# Patient Record
Sex: Female | Born: 1990 | Race: Black or African American | Hispanic: No | Marital: Single | Smoking: Never smoker
Health system: Southern US, Community
[De-identification: ages and names within clinical notes are randomized; demographics above are authoritative.]

## PROBLEM LIST (undated history)

## (undated) DIAGNOSIS — E079 Disorder of thyroid, unspecified: Secondary | ICD-10-CM

---

## 2021-04-20 NOTE — L&D Delivery Note (Signed)
Delivery Note Labor onset:  04/10/22 Labor Onset Time: 1857 Complete dilation at 1:05 AM  Onset of pushing at 0105 FHR second stage Cat 2 Analgesia/Anesthesia intrapartum: epidural  Guided pushing with maternal urge. Delivery of a viable female at 0114. Fetal head delivered in LOA position.  Nuchal cord: none.  Infant placed on maternal abd, dried, and tactile stim.  Cord double clamped after 4 min and cut by Father.  RN x3 present for birth.  Cord blood sample collected: yes Arterial cord blood sample collected: no  Placenta delivered Schultz, intact, with 3 VC.  Placenta to pathology for IUGR. Uterine tone firm, bleeding small, clot x1  NO laceration identified.  Anesthesia: N/A Repair: N/A QBL/EBL (mL): 157 Complications: none APGAR: APGAR (1 MIN): 9   APGAR (5 MINS): 10   APGAR (10 MINS):   Mom to postpartum.  Baby to Couplet care / Skin to Skin. Baby boy "Joy Ali" Circumcision Yes  [name] DNP, CNM 04/11/2022, 1:31 AM

## 2021-06-11 ENCOUNTER — Other Ambulatory Visit: Payer: Self-pay

## 2021-06-11 ENCOUNTER — Ambulatory Visit
Admission: EM | Admit: 2021-06-11 | Discharge: 2021-06-11 | Disposition: A | Discharge status: Home / Self Care | Payer: BC Managed Care – PPO | Attending: Physician Assistant | Admitting: Physician Assistant

## 2021-06-11 DIAGNOSIS — J069 Acute upper respiratory infection, unspecified: Secondary | ICD-10-CM | POA: Diagnosis not present

## 2021-06-11 NOTE — ED Provider Notes (Signed)
[facility]    CSN: [id number] Arrival date & time: 06/11/21  1603      History   Chief Complaint Chief Complaint  Patient presents with   Cough    HPI Derriona Branscom is a 31 y.o. female.   Patient here today for evaluation of cough, congestion, sneezing, burning in her nose, loss of taste and smell, and 1 episode of vomiting that started yesterday.  She does note she has felt more fatigued as well.  She has tried over-the-counter medication without resolution of symptoms.  She believes she has had fever but has not measured temperature.  The history is provided by the patient.   History reviewed. No pertinent past medical history.  There are no problems to display for this patient.   History reviewed. No pertinent surgical history.  OB History   No obstetric history on file.      Home Medications    Prior to Admission medications   Not on File    Family History History reviewed. No pertinent family history.  Social History Social History   Tobacco Use   Smoking status: Never   Smokeless tobacco: Never     Allergies   Patient has no allergy information on record.   Review of Systems Review of Systems  Constitutional:  Positive for fever.  HENT:  Positive for congestion and sore throat. Negative for ear pain.   Eyes:  Negative for discharge and redness.  Respiratory:  Positive for cough. Negative for shortness of breath and wheezing.   Gastrointestinal:  Positive for nausea and vomiting. Negative for abdominal pain and diarrhea.    Physical Exam Triage Vital Signs ED Triage Vitals  Enc Vitals Group     BP      Pulse      Resp      Temp      Temp src      SpO2      Weight      Height      Head Circumference      Peak Flow      Pain Score      Pain Loc      Pain Edu?      Excl. in GC?    No data found.  Updated Vital Signs BP 109/73 (BP Location: Left Arm)    Pulse 100    Temp 98.5 F (36.9 C) (Oral)    Resp 18     SpO2 98%       Physical Exam Vitals and nursing note reviewed.  Constitutional:      General: She is not in acute distress.    Appearance: Normal appearance. She is not ill-appearing.  HENT:     Head: Normocephalic and atraumatic.     Nose: Congestion present.     Mouth/Throat:     Mouth: Mucous membranes are moist.     Pharynx: No oropharyngeal exudate or posterior oropharyngeal erythema.  Eyes:     Conjunctiva/sclera: Conjunctivae normal.  Cardiovascular:     Rate and Rhythm: Normal rate and regular rhythm.     Heart sounds: Normal heart sounds. No murmur heard. Pulmonary:     Effort: Pulmonary effort is normal. No respiratory distress.     Breath sounds: Normal breath sounds. No wheezing, rhonchi or rales.  Skin:    General: Skin is warm and dry.  Neurological:     Mental Status: She is alert.  Psychiatric:        Mood  and Affect: Mood normal.        Behavior: Behavior normal.        Thought Content: Thought content normal.     UC Treatments / Results  Labs (all labs ordered are listed, but only abnormal results are displayed) Labs Reviewed  COVID-19, FLU A+B NAA    EKG   Radiology No results found.  Procedures Procedures (including critical care time)  Medications Ordered in UC Medications - No data to display  Initial Impression / Assessment and Plan / UC Course  I have reviewed the triage vital signs and the nursing notes.  Pertinent labs & imaging results that were available during my care of the patient were reviewed by me and considered in my medical decision making (see chart for details).    Suspect viral etiology of symptoms.  Will order COVID and flu screening.  Will await results further recommendation.  Encouraged symptomatic treatment while awaiting results.  Patient expresses understanding.   Final Clinical Impressions(s) / UC Diagnoses   Final diagnoses:  Acute upper respiratory infection   Discharge Instructions   None    ED  Prescriptions   None    PDMP not reviewed this encounter.   Tomi Bamberger, PA-C 06/11/21 1715

## 2021-06-11 NOTE — ED Triage Notes (Signed)
Pt c/o cough, sneezing, "burning sensation in my nose," headache, nasal drainage, drowsiness onset yesterday

## 2021-06-12 LAB — COVID-19, FLU A+B NAA
Influenza A, NAA: NOT DETECTED
Influenza B, NAA: NOT DETECTED
SARS-CoV-2, NAA: NOT DETECTED

## 2021-06-30 ENCOUNTER — Other Ambulatory Visit: Payer: Self-pay

## 2021-06-30 ENCOUNTER — Emergency Department (HOSPITAL_COMMUNITY)
Admission: EM | Admit: 2021-06-30 | Discharge: 2021-06-30 | Disposition: A | Discharge status: Home / Self Care | Payer: BC Managed Care – PPO | Attending: Emergency Medicine | Admitting: Emergency Medicine

## 2021-06-30 ENCOUNTER — Encounter (HOSPITAL_COMMUNITY): Payer: Self-pay

## 2021-06-30 DIAGNOSIS — T7840XA Allergy, unspecified, initial encounter: Secondary | ICD-10-CM | POA: Insufficient documentation

## 2021-06-30 MED ORDER — PREDNISONE 20 MG PO TABS: 40.0000 mg | ORAL_TABLET | Freq: Every day | ORAL | 0 refills | Status: DC

## 2021-06-30 MED ORDER — DIPHENHYDRAMINE HCL 25 MG PO TABS: 25.0000 mg | ORAL_TABLET | Freq: Four times a day (QID) | ORAL | 0 refills | Status: DC | PRN

## 2021-06-30 MED ORDER — EPINEPHRINE 0.3 MG/0.3ML IJ SOAJ: 0.3000 mg | INTRAMUSCULAR | 0 refills | Status: AC | PRN

## 2021-06-30 MED ORDER — PREDNISONE 20 MG PO TABS
60.0000 mg | ORAL_TABLET | Freq: Once | ORAL | Status: AC
  Administered 2021-06-30: 60 mg via ORAL
  Filled 2021-06-30: qty 3

## 2021-06-30 MED ORDER — FAMOTIDINE 20 MG PO TABS
40.0000 mg | ORAL_TABLET | Freq: Once | ORAL | Status: AC
  Administered 2021-06-30: 40 mg via ORAL
  Filled 2021-06-30: qty 2

## 2021-06-30 NOTE — ED Provider Notes (Signed)
?[facility]-EMERGENCY DEPT ?Provider Note ? ? ?CSN: [id number] ?Arrival date & time: 06/30/21  0113 ? ?  ? ?History ? ?Chief Complaint  ?Patient presents with  ? Allergic Reaction  ? ? ?Joy Ali is a 31 y.o. female. ? ?Patient presents to the emergency department for evaluation of allergic reaction.  She states that she took a BC powder late last night.  In the past while taking aspirin she gets "itching" in her throat but tonight it felt like her throat was closing up.  She states that she had a bottle of Benadryl at home and "chugged" an unknown amount.  EMS was called.  She was given EpiPen around 11:30 PM.  She states that she thought she was fine and refused transport to the hospital.  No other treatments.  About 30 minutes after they left she started to "feel weird".  This prompted her to come to the emergency department.  She denies hives, vomiting, diarrhea.  Her "throat closing" is improved.  She currently feels somewhat short of breath.  No hallucinations. ? ? ?  ? ?Home Medications ?Prior to Admission medications   ?Not on File  ?   ? ?Allergies    ?Ibuprofen   ? ?Review of Systems   ?Review of Systems ? ?Physical Exam ?Updated Vital Signs ?BP (!) 122/108 (BP Location: Left Arm)   Pulse 86   Temp 98.7 ?F (37.1 ?C) (Oral)   Resp 17   Ht 5' (1.524 m)   Wt 83.9 kg   SpO2 100%   BMI 36.13 kg/m?  ?Physical Exam ?Vitals and nursing note reviewed.  ?Constitutional:   ?   Appearance: She is well-developed. She is not diaphoretic.  ?HENT:  ?   Head: Normocephalic and atraumatic.  ?   Mouth/Throat:  ?   Mouth: Mucous membranes are not dry.  ?   Comments: Oropharynx is clear.  No signs of angioedema. ?Eyes:  ?   Conjunctiva/sclera: Conjunctivae normal.  ?Neck:  ?   Vascular: Normal carotid pulses. No JVD.  ?   Trachea: Trachea normal. No tracheal deviation.  ?Cardiovascular:  ?   Rate and Rhythm: Normal rate and regular rhythm.  ?   Pulses: No decreased pulses.     ?     Radial  pulses are 2+ on the right side and 2+ on the left side.  ?   Heart sounds: Normal heart sounds, S1 normal and S2 normal. No murmur heard. ?   Comments: No tachycardia ?Pulmonary:  ?   Effort: Pulmonary effort is normal. No respiratory distress.  ?   Breath sounds: No wheezing.  ?   Comments: No wheezing ?Chest:  ?   Chest wall: No tenderness.  ?Abdominal:  ?   General: Bowel sounds are normal.  ?   Palpations: Abdomen is soft.  ?   Tenderness: There is no abdominal tenderness. There is no guarding or rebound.  ?Musculoskeletal:     ?   General: Normal range of motion.  ?   Cervical back: Normal range of motion and neck supple. No muscular tenderness.  ?   Right lower leg: No edema.  ?   Left lower leg: No edema.  ?Skin: ?   General: Skin is warm and dry.  ?   Coloration: Skin is not pale.  ?Neurological:  ?   Mental Status: She is alert.  ? ? ?ED Results / Procedures / Treatments   ?Labs ?(all labs ordered are listed, but  only abnormal results are displayed) ?Labs Reviewed - No data to display ? ?EKG ?None ? ?Radiology ?No results found. ? ?Procedures ?Procedures  ? ? ?Medications Ordered in ED ?Medications  ?predniSONE (DELTASONE) tablet 60 mg (has no administration in time range)  ?famotidine (PEPCID) tablet 40 mg (has no administration in time range)  ? ? ?ED Course/ Medical Decision Making/ A&P ?  ? ?Patient seen and examined. History obtained directly from patient.  ? ?Labs/EKG: Ordered EKG.  Reviewed, no arrhythmia or signs of QT prolongation. ? ?Imaging: None ordered ? ?Medications/Fluids: Ordered: Oral prednisone, Pepcid. ? ?Most recent vital signs reviewed and are as follows: ?BP (!) 122/108 (BP Location: Left Arm)   Pulse 86   Temp 98.7 ?F (37.1 ?C) (Oral)   Resp 17   Ht 5' (1.524 m)   Wt 83.9 kg   SpO2 100%   BMI 36.13 kg/m?  ? ?Initial impression: Likely allergic reaction.  Patient may be having some anxiety symptoms related to the administration of epinephrine.  Also, consider anticholinergic  symptoms as patient took unknown amount of Benadryl. ? ?2:34 AM Reassessment performed. Patient appears comfortable.  She states that she is doing better. ? ?Reviewed pertinent lab work and imaging with patient at bedside. Questions answered.  ? ?Most current vital signs reviewed and are as follows: ?BP 110/76   Pulse 69   Temp 98.7 ?F (37.1 ?C) (Oral)   Resp 18   Ht 5' (1.524 m)   Wt 83.9 kg   SpO2 99%   BMI 36.13 kg/m?  ? ?Plan: Monitor until 3:30 AM.  If no rebound symptoms or signs of anticholinergic toxicity, plan for discharge home. ? ?3:29 AM Reassessment performed. Patient appears comfortable.  States that she continues to feel well.  She is ready to go home. ? ?Most current vital signs reviewed and are as follows: ?BP (!) 143/100   Pulse 75   Temp 98.7 ?F (37.1 ?C) (Oral)   Resp 14   Ht 5' (1.524 m)   Wt 83.9 kg   SpO2 99%   BMI 36.13 kg/m?  ? ?Plan: Discharge to home.  ? ?Prescriptions written for: Prednisone, Benadryl, EpiPen ? ?Other home care instructions discussed: Avoidance of allergens ? ?ED return instructions discussed: Encouraged patient to call 911 if symptoms recur, especially if it involves her throat or breathing. ? ?Follow-up instructions discussed: Patient encouraged to follow-up with their PCP as needed. ? ?                        ?Medical Decision Making ?Risk ?Prescription drug management. ? ? ?Patient with allergic reaction, likely to aspirin.  She was treated with an EpiPen but began to feel strange after the administration.  She also took an unknown amount of Benadryl.  Symptoms likely side effect of epinephrine.  This is now resolved.  Considered anticholinergic overdose, however patient has not had any progressive symptoms, EKG changes, and has been stable during course of ED visit. ? ?The patient's vital signs, pertinent lab work and imaging were reviewed and interpreted as discussed in the ED course. Hospitalization was considered for further testing, treatments, or  serial exams/observation. However as patient is well-appearing, has a stable exam, and reassuring studies today, I do not feel that they warrant admission at this time. This plan was discussed with the patient who verbalizes agreement and comfort with this plan and seems reliable and able to return to the Emergency Department with worsening or changing  symptoms.  ? ? ? ? ? ? ? ? ?Final Clinical Impression(s) / ED Diagnoses ?Final diagnoses:  ?Allergic reaction, initial encounter  ? ? ?Rx / DC Orders ?ED Discharge Orders   ? ? None  ? ?  ? ? ?  ?Renne Crigler, PA-C ?06/30/21 1610 ? ?  ?Paula Libra, MD ?06/30/21 0518 ? ?

## 2021-06-30 NOTE — ED Triage Notes (Signed)
Patient arrived stating she took a BC powder and began having an allergic reaction, took a benadryl and given an epi pen by EMS around 1130pm. States her chest feels weird from the medication and her throat feels swollen. Speaking in full sentences in triage.  ?

## 2021-06-30 NOTE — Discharge Instructions (Signed)
Please read and follow all provided instructions. ? ?Your diagnoses today include:  ?1. Allergic reaction, initial encounter   ? ? ?Tests performed today include: ?Vital signs. See below for your results today.  ? ?Medications prescribed:  ?Prednisone - steroid medicine  ? ?It is best to take this medication in the morning to prevent sleeping problems. If you are diabetic, monitor your blood sugar closely and stop taking Prednisone if blood sugar is over 300. Take with food to prevent stomach upset.  ? ?Benadryl (diphenhydramine) - antihistamine ? ?You can find this medication over-the-counter.  ? ?DO NOT exceed:  ?50mg  Benadryl every 6 hours   ? ?Benadryl will make you drowsy. DO NOT drive or perform any activities that require you to be awake and alert if taking this. ? ?Epi-pen ?Inject into thigh as directed if you have a severe reaction that causes throat swelling or any trouble breathing. Call 9-1-1 immediately if you use an Epi-pen. You should be evaluated at a hospital as soon as possible.   ? ?Take any prescribed medications only as directed. ? ?Home care instructions:  ?Follow any educational materials contained in this packet ? ?Follow-up instructions: ?Please follow-up with your primary care provider in the next 3 days for further evaluation of your symptoms.  ? ?Return instructions:  ?Please return to the Emergency Department if you experience worsening symptoms.  ?Call 9-1-1 immediately if you have an allergic reaction that involves your lips, mouth, throat or if you have any difficulty breathing. This is a life-threatening emergency.  ?Please return if you have any other emergent concerns. ? ?Additional Information: ? ?Your vital signs today were: ?BP (!) 139/100   Pulse 65   Temp 98.7 ?F (37.1 ?C) (Oral)   Resp 13   Ht 5' (1.524 m)   Wt 83.9 kg   SpO2 100%   BMI 36.13 kg/m?  ?If your blood pressure (BP) was elevated above 135/85 this visit, please have this repeated by your doctor within one  month. ?-------------- ? ?

## 2021-07-01 ENCOUNTER — Other Ambulatory Visit: Payer: Self-pay

## 2021-07-01 ENCOUNTER — Ambulatory Visit
Admission: EM | Admit: 2021-07-01 | Discharge: 2021-07-01 | Disposition: A | Discharge status: Home / Self Care | Payer: BC Managed Care – PPO | Attending: Physician Assistant | Admitting: Physician Assistant

## 2021-07-01 DIAGNOSIS — H0100A Unspecified blepharitis right eye, upper and lower eyelids: Secondary | ICD-10-CM

## 2021-07-01 DIAGNOSIS — H0100B Unspecified blepharitis left eye, upper and lower eyelids: Secondary | ICD-10-CM

## 2021-07-01 MED ORDER — ERYTHROMYCIN 5 MG/GM OP OINT
TOPICAL_OINTMENT | OPHTHALMIC | 0 refills | Status: DC
Start: 2021-07-01 — End: 2022-04-11

## 2021-07-01 NOTE — ED Provider Notes (Signed)
?[facility] ? ? ? ?CSN: [id number] ?Arrival date & time: 07/01/21  1342 ? ? ?  ? ?History   ?Chief Complaint ?Chief Complaint  ?Patient presents with  ? Facial Swelling  ?  Right eye  ? ? ?HPI ?Joy Ali is a 31 y.o. female.  ? ?Patient here today for evaluation of swelling of bilateral eyelids that first started this morning.  She states that she had allergic reaction that she was treated for in the ED but is not sure if this is related to same. She reports some drainage from her eyes. She has not had any fever. She has been taking prednisone as prescribed.  ? ?The history is provided by the patient.  ? ?History reviewed. No pertinent past medical history. ? ?There are no problems to display for this patient. ? ? ?History reviewed. No pertinent surgical history. ? ?OB History   ?No obstetric history on file. ?  ? ? ? ?Home Medications   ? ?Prior to Admission medications   ?Medication Sig Start Date End Date Taking? Authorizing Provider  ?erythromycin ophthalmic ointment Place a 1/2 inch ribbon of ointment into the lower eyelid. 07/01/21  Yes Tomi Bamberger, PA-C  ?diphenhydrAMINE (BENADRYL) 25 MG tablet Take 1 tablet (25 mg total) by mouth every 6 (six) hours as needed. 06/30/21   Renne Crigler, PA-C  ?EPINEPHrine 0.3 mg/0.3 mL IJ SOAJ injection Inject 0.3 mg into the muscle as needed for anaphylaxis. 06/30/21   Renne Crigler, PA-C  ?predniSONE (DELTASONE) 20 MG tablet Take 2 tablets (40 mg total) by mouth daily. 06/30/21   Renne Crigler, PA-C  ? ? ?Family History ?Family History  ?Problem Relation Age of Onset  ? Hypertension Father   ? Diabetes Father   ? ? ?Social History ?Social History  ? ?Tobacco Use  ? Smoking status: Never  ? Smokeless tobacco: Never  ? ? ? ?Allergies   ?Ibuprofen ? ? ?Review of Systems ?Review of Systems  ?Constitutional:  Negative for chills and fever.  ?HENT:  Negative for congestion.   ?Eyes:  Negative for discharge, redness and visual disturbance.  ?Respiratory:   Negative for shortness of breath.   ?Gastrointestinal:  Negative for abdominal pain, nausea and vomiting.  ? ? ?Physical Exam ?Triage Vital Signs ?ED Triage Vitals  ?Enc Vitals Group  ?   BP 07/01/21 1423 121/73  ?   Pulse Rate 07/01/21 1423 78  ?   Resp 07/01/21 1423 18  ?   Temp 07/01/21 1423 98.3 ?F (36.8 ?C)  ?   Temp Source 07/01/21 1423 Oral  ?   SpO2 07/01/21 1423 99 %  ?   Weight --   ?   Height --   ?   Head Circumference --   ?   Peak Flow --   ?   Pain Score 07/01/21 1427 6  ?   Pain Loc --   ?   Pain Edu? --   ?   Excl. in GC? --   ? ?No data found. ? ?Updated Vital Signs ?BP 121/73 (BP Location: Left Arm)   Pulse 78   Temp 98.3 ?F (36.8 ?C) (Oral)   Resp 18   SpO2 99%  ? ? ?Physical Exam ?Vitals and nursing note reviewed.  ?Constitutional:   ?   General: She is not in acute distress. ?   Appearance: Normal appearance. She is not ill-appearing.  ?HENT:  ?   Head: Normocephalic and atraumatic.  ?Eyes:  ?  Conjunctiva/sclera: Conjunctivae normal.  ?   Comments: Bilateral upper and lower lids swollen with right worse than left  ?Cardiovascular:  ?   Rate and Rhythm: Normal rate.  ?Pulmonary:  ?   Effort: Pulmonary effort is normal.  ?Neurological:  ?   Mental Status: She is alert.  ?Psychiatric:     ?   Mood and Affect: Mood normal.     ?   Behavior: Behavior normal.     ?   Thought Content: Thought content normal.  ? ? ? ?UC Treatments / Results  ?Labs ?(all labs ordered are listed, but only abnormal results are displayed) ?Labs Reviewed - No data to display ? ?EKG ? ? ?Radiology ?No results found. ? ?Procedures ?Procedures (including critical care time) ? ?Medications Ordered in UC ?Medications - No data to display ? ?Initial Impression / Assessment and Plan / UC Course  ?I have reviewed the triage vital signs and the nursing notes. ? ?Pertinent labs & imaging results that were available during my care of the patient were reviewed by me and considered in my medical decision making (see chart for  details). ? ?  ?Will treat with antibiotic ointment and recommended cool compresses. Encouraged she continue her prednisone and follow up with any further concerns.  ? ?Final Clinical Impressions(s) / UC Diagnoses  ? ?Final diagnoses:  ?Blepharitis of upper and lower eyelids of both eyes, unspecified type  ? ?Discharge Instructions   ?None ?  ? ?ED Prescriptions   ? ? Medication Sig Dispense Auth. Provider  ? erythromycin ophthalmic ointment Place a 1/2 inch ribbon of ointment into the lower eyelid. 3.5 g Tomi Bamberger, PA-C  ? ?  ? ?PDMP not reviewed this encounter. ?  ?Tomi Bamberger, PA-C ?07/01/21 1501 ? ?

## 2021-07-01 NOTE — ED Triage Notes (Signed)
Seen at the ED yesterday for an allergic reaction. Pt reports that she woke up this morning to see that her right eye was swollen. She also complains of drowsiness. Pt is concerned that the meds she received in the ED could be causing sxs.Notes pain w/blinking and minimal drainage. No decrease in visual acuity. ?

## 2021-07-15 ENCOUNTER — Ambulatory Visit
Admission: EM | Admit: 2021-07-15 | Discharge: 2021-07-15 | Disposition: A | Discharge status: Home / Self Care | Payer: BC Managed Care – PPO | Attending: Physician Assistant | Admitting: Physician Assistant

## 2021-07-15 ENCOUNTER — Other Ambulatory Visit: Payer: Self-pay

## 2021-07-15 DIAGNOSIS — J3489 Other specified disorders of nose and nasal sinuses: Secondary | ICD-10-CM | POA: Diagnosis not present

## 2021-07-15 MED ORDER — MUPIROCIN 2 % EX OINT: 1.0000 "application " | TOPICAL_OINTMENT | Freq: Two times a day (BID) | CUTANEOUS | 0 refills | Status: AC

## 2021-07-15 NOTE — ED Provider Notes (Signed)
?[facility] ? ? ? ?CSN: [id number] ?Arrival date & time: 07/15/21  1152 ? ? ?  ? ?History   ?Chief Complaint ?Chief Complaint  ?Patient presents with  ? sores in nares  ? ? ?HPI ?Joy Ali is a 31 y.o. female.  ? ?Patient here today for evaluation of painful lesions in her nose that has been present for the last 2 weeks. She reports they have appeared like "pimples" at times. She has not used any topical treatment. She has not had any fever or other symptoms.  ? ?The history is provided by the patient.  ? ?History reviewed. No pertinent past medical history. ? ?There are no problems to display for this patient. ? ? ?History reviewed. No pertinent surgical history. ? ?OB History   ?No obstetric history on file. ?  ? ? ? ?Home Medications   ? ?Prior to Admission medications   ?Medication Sig Start Date End Date Taking? Authorizing Provider  ?mupirocin ointment (BACTROBAN) 2 % Apply 1 application. topically 2 (two) times daily for 7 days. 07/15/21 07/22/21 Yes Tomi Bamberger, PA-C  ?diphenhydrAMINE (BENADRYL) 25 MG tablet Take 1 tablet (25 mg total) by mouth every 6 (six) hours as needed. 06/30/21   Renne Crigler, PA-C  ?EPINEPHrine 0.3 mg/0.3 mL IJ SOAJ injection Inject 0.3 mg into the muscle as needed for anaphylaxis. 06/30/21   Renne Crigler, PA-C  ?erythromycin ophthalmic ointment Place a 1/2 inch ribbon of ointment into the lower eyelid. 07/01/21   Tomi Bamberger, PA-C  ?predniSONE (DELTASONE) 20 MG tablet Take 2 tablets (40 mg total) by mouth daily. 06/30/21   Renne Crigler, PA-C  ? ? ?Family History ?Family History  ?Problem Relation Age of Onset  ? Hypertension Father   ? Diabetes Father   ? ? ?Social History ?Social History  ? ?Tobacco Use  ? Smoking status: Never  ? Smokeless tobacco: Never  ? ? ? ?Allergies   ?Ibuprofen ? ? ?Review of Systems ?Review of Systems  ?Constitutional:  Negative for chills and fever.  ?HENT:  Negative for congestion.   ?Eyes:  Negative for discharge and  redness.  ?Respiratory:  Negative for shortness of breath.   ?Gastrointestinal:  Negative for abdominal pain, nausea and vomiting.  ? ? ?Physical Exam ?Triage Vital Signs ?ED Triage Vitals  ?Enc Vitals Group  ?   BP 07/15/21 1228 119/80  ?   Pulse Rate 07/15/21 1228 79  ?   Resp 07/15/21 1228 18  ?   Temp 07/15/21 1228 98.2 ?F (36.8 ?C)  ?   Temp Source 07/15/21 1228 Oral  ?   SpO2 07/15/21 1228 98 %  ?   Weight --   ?   Height --   ?   Head Circumference --   ?   Peak Flow --   ?   Pain Score 07/15/21 1226 6  ?   Pain Loc --   ?   Pain Edu? --   ?   Excl. in GC? --   ? ?No data found. ? ?Updated Vital Signs ?BP 119/80 (BP Location: Right Arm)   Pulse 79   Temp 98.2 ?F (36.8 ?C) (Oral)   Resp 18   SpO2 98%  ?   ? ?Physical Exam ?Vitals and nursing note reviewed.  ?Constitutional:   ?   General: She is not in acute distress. ?   Appearance: Normal appearance. She is not ill-appearing.  ?HENT:  ?   Head: Normocephalic and atraumatic.  ?  Nose:  ?   Comments: Few erythematous papules noted to bilateral nares ?Eyes:  ?   Conjunctiva/sclera: Conjunctivae normal.  ?Cardiovascular:  ?   Rate and Rhythm: Normal rate.  ?Pulmonary:  ?   Effort: Pulmonary effort is normal.  ?Neurological:  ?   Mental Status: She is alert.  ?Psychiatric:     ?   Mood and Affect: Mood normal.     ?   Behavior: Behavior normal.     ?   Thought Content: Thought content normal.  ? ? ? ?UC Treatments / Results  ?Labs ?(all labs ordered are listed, but only abnormal results are displayed) ?Labs Reviewed - No data to display ? ?EKG ? ? ?Radiology ?No results found. ? ?Procedures ?Procedures (including critical care time) ? ?Medications Ordered in UC ?Medications - No data to display ? ?Initial Impression / Assessment and Plan / UC Course  ?I have reviewed the triage vital signs and the nursing notes. ? ?Pertinent labs & imaging results that were available during my care of the patient were reviewed by me and considered in my medical decision  making (see chart for details). ? ?  ?Will trial mupirocin ointment and recommend follow up with any further concerns.  ? ?Final Clinical Impressions(s) / UC Diagnoses  ? ?Final diagnoses:  ?Nasal lesion  ? ?Discharge Instructions   ?None ?  ? ?ED Prescriptions   ? ? Medication Sig Dispense Auth. Provider  ? mupirocin ointment (BACTROBAN) 2 % Apply 1 application. topically 2 (two) times daily for 7 days. 22 g Tomi Bamberger, PA-C  ? ?  ? ?PDMP not reviewed this encounter. ?  ?Tomi Bamberger, PA-C ?07/15/21 1256 ? ?

## 2021-07-15 NOTE — ED Triage Notes (Signed)
2wk h/o painful sores in nares. ?Pt describes the sores as "red and white tiny pimples". No ointments or nasal sprays used. Notes some itching. ?

## 2021-07-22 ENCOUNTER — Ambulatory Visit
Admission: EM | Admit: 2021-07-22 | Discharge: 2021-07-22 | Disposition: A | Discharge status: Home / Self Care | Payer: BC Managed Care – PPO | Attending: Internal Medicine | Admitting: Internal Medicine

## 2021-07-22 DIAGNOSIS — H5789 Other specified disorders of eye and adnexa: Secondary | ICD-10-CM | POA: Diagnosis not present

## 2021-07-22 NOTE — ED Triage Notes (Signed)
Pt said once she woke up this am she noticed her eyes were irritated and red, they are itchy and sore to touch. No blurred vision. Pt does have allergies. On zyrtec and eye allergy stuff.  ?

## 2021-07-22 NOTE — Discharge Instructions (Signed)
Go to [facility] at provided address tomorrow at 8 AM for further evaluation and management. ?

## 2021-07-22 NOTE — ED Provider Notes (Signed)
?[facility] ? ? ? ?CSN: [id number] ?Arrival date & time: 07/22/21  1711 ? ? ?  ? ?History   ?Chief Complaint ?Chief Complaint  ?Patient presents with  ? Eye Problem  ? ? ?HPI ?Joy Ali is a 31 y.o. female.  ? ?Patient presents for persistent bilateral eye redness and irritation that started upon awakening this morning.  Denies any purulent drainage.  Patient does report some blurry vision upon awakening.  Does not wear contacts or glasses.  Denies trauma or foreign body to the eye.  Patient is attributing symptoms to allergies and has taken Zyrtec and Visine with minimal improvement.  She was seen on 3/14 and was diagnosed with blepharitis.  She was prescribed erythromycin ointment which she has tried at this time as well with no improvement. ? ? ?Eye Problem ? ?No past medical history on file. ? ?There are no problems to display for this patient. ? ? ?No past surgical history on file. ? ?OB History   ?No obstetric history on file. ?  ? ? ? ?Home Medications   ? ?Prior to Admission medications   ?Medication Sig Start Date End Date Taking? Authorizing Provider  ?diphenhydrAMINE (BENADRYL) 25 MG tablet Take 1 tablet (25 mg total) by mouth every 6 (six) hours as needed. 06/30/21   Renne Crigler, PA-C  ?EPINEPHrine 0.3 mg/0.3 mL IJ SOAJ injection Inject 0.3 mg into the muscle as needed for anaphylaxis. 06/30/21   Renne Crigler, PA-C  ?erythromycin ophthalmic ointment Place a 1/2 inch ribbon of ointment into the lower eyelid. 07/01/21   Tomi Bamberger, PA-C  ?mupirocin ointment (BACTROBAN) 2 % Apply 1 application. topically 2 (two) times daily for 7 days. 07/15/21 07/22/21  Tomi Bamberger, PA-C  ?predniSONE (DELTASONE) 20 MG tablet Take 2 tablets (40 mg total) by mouth daily. 06/30/21   Renne Crigler, PA-C  ? ? ?Family History ?Family History  ?Problem Relation Age of Onset  ? Hypertension Father   ? Diabetes Father   ? ? ?Social History ?Social History  ? ?Tobacco Use  ? Smoking status: Never  ?  Smokeless tobacco: Never  ? ? ? ?Allergies   ?Ibuprofen ? ? ?Review of Systems ?Review of Systems ?Per HPI ? ?Physical Exam ?Triage Vital Signs ?ED Triage Vitals  ?Enc Vitals Group  ?   BP 07/22/21 1748 109/79  ?   Pulse Rate 07/22/21 1748 98  ?   Resp 07/22/21 1748 16  ?   Temp 07/22/21 1748 98.6 ?F (37 ?C)  ?   Temp Source 07/22/21 1748 Oral  ?   SpO2 07/22/21 1748 97 %  ?   Weight --   ?   Height --   ?   Head Circumference --   ?   Peak Flow --   ?   Pain Score 07/22/21 1747 6  ?   Pain Loc --   ?   Pain Edu? --   ?   Excl. in GC? --   ? ?No data found. ? ?Updated Vital Signs ?BP 109/79 (BP Location: Right Arm)   Pulse 98   Temp 98.6 ?F (37 ?C) (Oral)   Resp 16   SpO2 97%  ? ?Visual Acuity ?Right Eye Distance: 20 20 ?Left Eye Distance: 20 20 ?Bilateral Distance: 20 20 ? ?Right Eye Near:   ?Left Eye Near:    ?Bilateral Near:    ? ?Physical Exam ?Constitutional:   ?   General: She is not in acute distress. ?  Appearance: Normal appearance. She is not toxic-appearing or diaphoretic.  ?HENT:  ?   Head: Normocephalic and atraumatic.  ?Eyes:  ?   General: Lids are normal. Lids are everted, no foreign bodies appreciated. Vision grossly intact. Gaze aligned appropriately.  ?   Extraocular Movements: Extraocular movements intact.  ?   Conjunctiva/sclera: Conjunctivae normal.  ?   Pupils: Pupils are equal, round, and reactive to light.  ?   Comments: Scleral redness throughout.  Conjunctive are normal.  No purulent drainage.  No swelling.  ?Pulmonary:  ?   Effort: Pulmonary effort is normal.  ?Neurological:  ?   General: No focal deficit present.  ?   Mental Status: She is alert and oriented to person, place, and time. Mental status is at baseline.  ?Psychiatric:     ?   Mood and Affect: Mood normal.     ?   Behavior: Behavior normal.     ?   Thought Content: Thought content normal.     ?   Judgment: Judgment normal.  ? ? ? ?UC Treatments / Results  ?Labs ?(all labs ordered are listed, but only abnormal results are  displayed) ?Labs Reviewed - No data to display ? ?EKG ? ? ?Radiology ?No results found. ? ?Procedures ?Procedures (including critical care time) ? ?Medications Ordered in UC ?Medications - No data to display ? ?Initial Impression / Assessment and Plan / UC Course  ?I have reviewed the triage vital signs and the nursing notes. ? ?Pertinent labs & imaging results that were available during my care of the patient were reviewed by me and considered in my medical decision making (see chart for details). ? ?  ? ?Unsure etiology of patient's eye symptoms.  Does not appear to be simple conjunctivitis.  Due to symptoms being refractory to medications, Dr. Sherryll Burger with on-call ophthalmology was consulted.  He would like to see her in office tomorrow at 8 AM for further evaluation and management.  Patient was agreeable with plan and provided with contact information and address.  Discussed return precautions.  Visual acuity normal.  Patient verbalized understanding and was agreeable with plan ?Final Clinical Impressions(s) / UC Diagnoses  ? ?Final diagnoses:  ?Eye irritation  ? ? ? ?Discharge Instructions   ? ?  ?Go to [facility] at provided address tomorrow at 8 AM for further evaluation and management. ? ? ? ? ?ED Prescriptions   ?None ?  ? ?PDMP not reviewed this encounter. ?  ?Gustavus Bryant, [place] ?07/22/21 1821 ? ?

## 2021-08-24 ENCOUNTER — Other Ambulatory Visit: Payer: Self-pay

## 2021-08-24 ENCOUNTER — Emergency Department (HOSPITAL_COMMUNITY)
Admission: EM | Admit: 2021-08-24 | Discharge: 2021-08-24 | Disposition: A | Discharge status: Home / Self Care | Payer: BC Managed Care – PPO | Attending: Emergency Medicine | Admitting: Emergency Medicine

## 2021-08-24 ENCOUNTER — Encounter (HOSPITAL_COMMUNITY): Payer: Self-pay

## 2021-08-24 DIAGNOSIS — D649 Anemia, unspecified: Secondary | ICD-10-CM | POA: Insufficient documentation

## 2021-08-24 DIAGNOSIS — O26891 Other specified pregnancy related conditions, first trimester: Secondary | ICD-10-CM | POA: Insufficient documentation

## 2021-08-24 DIAGNOSIS — O99011 Anemia complicating pregnancy, first trimester: Secondary | ICD-10-CM | POA: Diagnosis not present

## 2021-08-24 DIAGNOSIS — Z3A00 Weeks of gestation of pregnancy not specified: Secondary | ICD-10-CM | POA: Insufficient documentation

## 2021-08-24 DIAGNOSIS — O99281 Endocrine, nutritional and metabolic diseases complicating pregnancy, first trimester: Secondary | ICD-10-CM | POA: Diagnosis not present

## 2021-08-24 DIAGNOSIS — E876 Hypokalemia: Secondary | ICD-10-CM | POA: Insufficient documentation

## 2021-08-24 DIAGNOSIS — Z331 Pregnant state, incidental: Secondary | ICD-10-CM | POA: Insufficient documentation

## 2021-08-24 DIAGNOSIS — R002 Palpitations: Secondary | ICD-10-CM | POA: Diagnosis not present

## 2021-08-24 DIAGNOSIS — Z79899 Other long term (current) drug therapy: Secondary | ICD-10-CM | POA: Diagnosis not present

## 2021-08-24 DIAGNOSIS — R5383 Other fatigue: Secondary | ICD-10-CM

## 2021-08-24 DIAGNOSIS — E039 Hypothyroidism, unspecified: Secondary | ICD-10-CM | POA: Diagnosis not present

## 2021-08-24 LAB — CBC WITH DIFFERENTIAL/PLATELET
Abs Immature Granulocytes: 0.04 10*3/uL (ref 0.00–0.07)
Basophils Absolute: 0 10*3/uL (ref 0.0–0.1)
Basophils Relative: 1 %
Eosinophils Absolute: 0.2 10*3/uL (ref 0.0–0.5)
Eosinophils Relative: 2 %
HCT: 32.6 % — ABNORMAL LOW (ref 36.0–46.0)
Hemoglobin: 11.2 g/dL — ABNORMAL LOW (ref 12.0–15.0)
Immature Granulocytes: 1 %
Lymphocytes Relative: 29 %
Lymphs Abs: 2.5 10*3/uL (ref 0.7–4.0)
MCH: 29.3 pg (ref 26.0–34.0)
MCHC: 34.4 g/dL (ref 30.0–36.0)
MCV: 85.3 fL (ref 80.0–100.0)
Monocytes Absolute: 0.9 10*3/uL (ref 0.1–1.0)
Monocytes Relative: 10 %
Neutro Abs: 5.1 10*3/uL (ref 1.7–7.7)
Neutrophils Relative %: 57 %
Platelets: 338 10*3/uL (ref 150–400)
RBC: 3.82 MIL/uL — ABNORMAL LOW (ref 3.87–5.11)
RDW: 14.4 % (ref 11.5–15.5)
WBC: 8.7 10*3/uL (ref 4.0–10.5)
nRBC: 0 % (ref 0.0–0.2)

## 2021-08-24 LAB — TSH: TSH: 7.193 u[IU]/mL — ABNORMAL HIGH (ref 0.350–4.500)

## 2021-08-24 LAB — URINALYSIS, ROUTINE W REFLEX MICROSCOPIC
Bilirubin Urine: NEGATIVE
Glucose, UA: NEGATIVE mg/dL
Ketones, ur: NEGATIVE mg/dL
Nitrite: NEGATIVE
Protein, ur: NEGATIVE mg/dL
Specific Gravity, Urine: 1.02 (ref 1.005–1.030)
pH: 6 (ref 5.0–8.0)

## 2021-08-24 LAB — T4, FREE: Free T4: 0.86 ng/dL (ref 0.61–1.12)

## 2021-08-24 LAB — COMPREHENSIVE METABOLIC PANEL
ALT: 12 U/L (ref 0–44)
AST: 19 U/L (ref 15–41)
Albumin: 3.9 g/dL (ref 3.5–5.0)
Alkaline Phosphatase: 34 U/L — ABNORMAL LOW (ref 38–126)
Anion gap: 6 (ref 5–15)
BUN: 9 mg/dL (ref 6–20)
CO2: 24 mmol/L (ref 22–32)
Calcium: 8.4 mg/dL — ABNORMAL LOW (ref 8.9–10.3)
Chloride: 106 mmol/L (ref 98–111)
Creatinine, Ser: 0.8 mg/dL (ref 0.44–1.00)
GFR, Estimated: >60 mL/min (ref >60)
Glucose, Bld: 111 mg/dL — ABNORMAL HIGH (ref 70–99)
Potassium: 3.2 mmol/L — ABNORMAL LOW (ref 3.5–5.1)
Sodium: 136 mmol/L (ref 135–145)
Total Bilirubin: 0.9 mg/dL (ref 0.3–1.2)
Total Protein: 7 g/dL (ref 6.5–8.1)

## 2021-08-24 LAB — RAPID URINE DRUG SCREEN, HOSP PERFORMED
Amphetamines: NOT DETECTED
Barbiturates: NOT DETECTED
Benzodiazepines: NOT DETECTED
Cocaine: NOT DETECTED
Opiates: NOT DETECTED
Tetrahydrocannabinol: NOT DETECTED

## 2021-08-24 LAB — I-STAT BETA HCG BLOOD, ED (MC, WL, AP ONLY): I-stat hCG, quantitative: >2000 m[IU]/mL — ABNORMAL HIGH (ref <5)

## 2021-08-24 MED ORDER — LEVOTHYROXINE SODIUM 125 MCG PO TABS: 125.0000 ug | ORAL_TABLET | Freq: Every day | ORAL | 0 refills | Status: AC

## 2021-08-24 MED ORDER — SODIUM CHLORIDE 0.9 % IV BOLUS
1000.0000 mL | Freq: Once | INTRAVENOUS | Status: AC
  Administered 2021-08-24: 1000 mL via INTRAVENOUS

## 2021-08-24 NOTE — ED Provider Notes (Signed)
?[facility]-EMERGENCY DEPT ?Provider Note ? ? ?CSN: [id number] ?Arrival date & time: 08/24/21  0208 ? ?  ? ?History ? ?Chief Complaint  ?Patient presents with  ? Fatigue  ? ? ?Joy Ali is a 31 y.o. female. ? ?31 year old female brought in by EMS with complaint of not feeling well today. Patient reports feeling fatigued today, tried to lay down and sleep however was unable to sleep so she stood up and at that time felt lightheaded, dizzy and nauseous, felt her heart was racing.  Patient called EMS, states she was able to walk to the ambulance but had another episode of heart racing while in transit to the emergency room tonight.  States that she is only had about 2 bottles of water today, mouth is dry, has only had some chicken wings to eat in the past 24 hours.  Reports that she could possibly be pregnant (LMP 07/21/21, G3P1), is not breast-feeding.  No significant past medical history.  States that she did injure her back at work a few days ago, was prescribed a muscle relaxer but is not taking it.  Denies changes in bowel or bladder habits, vomiting, fevers or chills.  No other complaints or concerns today. ? ? ?  ? ?Home Medications ?Prior to Admission medications   ?Medication Sig Start Date End Date Taking? Authorizing Provider  ?levothyroxine (SYNTHROID) 125 MCG tablet Take 1 tablet (125 mcg total) by mouth daily before breakfast. 08/24/21 09/23/21 Yes Jeannie Fend, PA-C  ?diphenhydrAMINE (BENADRYL) 25 MG tablet Take 1 tablet (25 mg total) by mouth every 6 (six) hours as needed. 06/30/21   Renne Crigler, PA-C  ?EPINEPHrine 0.3 mg/0.3 mL IJ SOAJ injection Inject 0.3 mg into the muscle as needed for anaphylaxis. 06/30/21   Renne Crigler, PA-C  ?erythromycin ophthalmic ointment Place a 1/2 inch ribbon of ointment into the lower eyelid. 07/01/21   Tomi Bamberger, PA-C  ?predniSONE (DELTASONE) 20 MG tablet Take 2 tablets (40 mg total) by mouth daily. 06/30/21   Renne Crigler, PA-C  ?    ? ?Allergies    ?Ibuprofen   ? ?Review of Systems   ?Review of Systems ?Negative except as per HPI ?Physical Exam ?Updated Vital Signs ?BP (!) 158/102   Pulse 82   Temp 98.4 ?F (36.9 ?C) (Oral)   Resp 15   SpO2 100%  ?Physical Exam ?Vitals and nursing note reviewed.  ?Constitutional:   ?   General: She is not in acute distress. ?   Appearance: She is well-developed. She is not diaphoretic.  ?HENT:  ?   Head: Normocephalic and atraumatic.  ?   Mouth/Throat:  ?   Mouth: Mucous membranes are dry.  ?Cardiovascular:  ?   Rate and Rhythm: Normal rate and regular rhythm.  ?   Pulses: Normal pulses.  ?   Heart sounds: Normal heart sounds.  ?Pulmonary:  ?   Effort: Pulmonary effort is normal.  ?   Breath sounds: Normal breath sounds.  ?Abdominal:  ?   Palpations: Abdomen is soft.  ?   Tenderness: There is no abdominal tenderness.  ?Musculoskeletal:  ?   Cervical back: Neck supple.  ?   Right lower leg: No edema.  ?   Left lower leg: No edema.  ?Skin: ?   General: Skin is warm and dry.  ?   Findings: No erythema or rash.  ?Neurological:  ?   Mental Status: She is alert and oriented to person, place, and  time.  ?Psychiatric:     ?   Behavior: Behavior normal.  ? ? ?ED Results / Procedures / Treatments   ?Labs ?(all labs ordered are listed, but only abnormal results are displayed) ?Labs Reviewed  ?TSH - Abnormal; Notable for the following components:  ?    Result Value  ? TSH 7.193 (*)   ? All other components within normal limits  ?URINALYSIS, ROUTINE W REFLEX MICROSCOPIC - Abnormal; Notable for the following components:  ? APPearance CLOUDY (*)   ? Hgb urine dipstick SMALL (*)   ? Leukocytes,Ua LARGE (*)   ? Bacteria, UA RARE (*)   ? All other components within normal limits  ?CBC WITH DIFFERENTIAL/PLATELET - Abnormal; Notable for the following components:  ? RBC 3.82 (*)   ? Hemoglobin 11.2 (*)   ? HCT 32.6 (*)   ? All other components within normal limits  ?COMPREHENSIVE METABOLIC PANEL - Abnormal; Notable for the  following components:  ? Potassium 3.2 (*)   ? Glucose, Bld 111 (*)   ? Calcium 8.4 (*)   ? Alkaline Phosphatase 34 (*)   ? All other components within normal limits  ?I-STAT BETA HCG BLOOD, ED ([facility], [facility], [facility] ONLY) - Abnormal; Notable for the following components:  ? I-stat hCG, quantitative >2,000.0 (*)   ? All other components within normal limits  ?URINE CULTURE  ?RAPID URINE DRUG SCREEN, HOSP PERFORMED  ?T4, FREE  ? ? ?EKG ?EKG Interpretation ? ?Date/Time:  Sunday Aug 24 2021 02:58:54 EDT ?Ventricular Rate:  81 ?PR Interval:  150 ?QRS Duration: 84 ?QT Interval:  392 ?QTC Calculation: 455 ?R Axis:   81 ?Text Interpretation: Sinus rhythm Normal ECG When compared with ECG of EARLIER SAME DATE QT has shortened Confirmed by Delora Fuel ([id number]) on 08/24/2021 3:00:57 AM ? ?Radiology ?No results found. ? ?Procedures ?Procedures  ? ? ?Medications Ordered in ED ?Medications  ?sodium chloride 0.9 % bolus 1,000 mL (0 mLs Intravenous Stopped 08/24/21 0418)  ? ? ?ED Course/ Medical Decision Making/ A&P ?  ?                        ?Medical Decision Making ?Amount and/or Complexity of Data Reviewed ?Labs: ordered. ? ?Risk ?Prescription drug management. ? ? ?This patient presents to the ED for concern of fatigue, palpitations, dizziness, this involves an extensive number of treatment options, and is a complaint that carries with it a high risk of complications and morbidity.  The differential diagnosis includes dehydration, thyroid abnormality, pregnancy, poor oral intake ? ? ?Co morbidities that complicate the patient evaluation ? ?Found to be pregnant today, no other significant PMH ? ? ?Additional history obtained: ? ?External records from outside source obtained and reviewed including recent visit to UC, prescribed topical steroids for eczema  ? ? ?Lab Tests: ? ?I Ordered, and personally interpreted labs.  The pertinent results include:  HCG +, THS  elevated at 7.192 (add on free T4), UDS negative, UA contaminated, positive for  leukocytes (no urinary symptoms, given + preg, add on cx), CMP with mild hypo K, not vomiting/no diarrhea. CBC with mild anemia with Hgb 11.2 ? ?Cardiac Monitoring: / EKG: ? ?The patient was maintained on a cardiac monitor.  I personally viewed and interpreted the cardiac monitored which showed an underlying rhythm of: NSR rate 81 ? ?Problem List / ED Course / Critical interventions / Medication management ? ?31yo female brought in by EMS as above. Well appearing on exam,  lungs CTA, HR RRR, abdomen soft and non tender. Given IVF, found to be pregnant, suspect 4-5 weeks based on dates. TSH checked for palpitations/fatigue, found to be elevated, add on FT4, started on synthroid and advised to follow up with OB for further work up and monitoring. UA likely contaminated, add on cx for pregnancy. Patient feeling improved, plan is for dc to follow up with OB. Discussed return precautions- OB emergencies to [facility] if possible.  ?I ordered medication including IVF ?Reevaluation of the patient after these medicines showed that the patient improved ?I have reviewed the patients home medicines and have made adjustments as needed ? ? ?Social Determinants of Health: ? ?Pregnant, no PCP ? ? ?Test / Admission - Considered: ? ?Considered OB US however not having abdominal pain or bleeding at this time, likely very early pregnancy with LMP 4-5 weeks ago, uterus not palpable, bedside US limited by habitus- no obvious fetus ? ? ? ? ? ? ? ? 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 ? ?Rx / DC Orders ?ED Discharge Orders   ? ?      Ordered  ?  levothyroxine (SYNTHROID) 125 MCG tablet  Daily before breakfast       ? 08/24/21 0457  ? ?  ?  ? ?  ? ? ?  ?Tacy Learn, PA-C ?08/24/21 [id number] ? ?  ?Delora Fuel, MD ?[id number] ? ?

## 2021-08-24 NOTE — Discharge Instructions (Addendum)
STOP previously prescribed steroid cream from urgent care (due to pregnancy). ?Follow up with OB. ?Take a prenatal vitamin daily. ?Take synthroid daily as prescribed for your thyroid. Your OB can follow up and monitor this.  ? ? ?You can make an appointment to see a GYN provider:  ? ?[facility] [employer] [facility]  ?[address]  ?[phone]  ? ?[facility] [employer] at [facility]  ?[address]  ?([place]  ? ?[facility] [employer] [facility] at [place]  ?[address]  ?[phone]  ? ?[facility] [employer] at [facility]  ?[address]  ?[phone]  ? ?[facility] [employer] [facility]  ?[place]  ?[phone]  ? ?If you already have an established OB/GYN provider in the area you can make an appointment with them as well. ? ?

## 2021-08-24 NOTE — ED Triage Notes (Signed)
Pt. BIB [facility] c/o fatigue x3 day. Per EMS pt. States that she slept most of the day today, woke up and was still tired. She went to stand and she started to feel lightheaded, nauseous, and dizzy. She also felt like she was having heart palpitations. ? ?EMS VS: ?CBG:121 ?BP:134/98 ?HR: 95 ?

## 2021-08-25 LAB — URINE CULTURE

## 2021-09-09 LAB — OB RESULTS CONSOLE ABO/RH: RH Type: POSITIVE

## 2021-09-09 LAB — OB RESULTS CONSOLE ANTIBODY SCREEN: Antibody Screen: NEGATIVE

## 2021-09-09 LAB — OB RESULTS CONSOLE HIV ANTIBODY (ROUTINE TESTING): HIV: NONREACTIVE

## 2021-09-09 LAB — OB RESULTS CONSOLE HEPATITIS B SURFACE ANTIGEN: Hepatitis B Surface Ag: NEGATIVE

## 2021-09-16 ENCOUNTER — Encounter (HOSPITAL_COMMUNITY): Payer: Self-pay | Admitting: Emergency Medicine

## 2021-09-16 ENCOUNTER — Emergency Department (HOSPITAL_COMMUNITY)
Admission: EM | Admit: 2021-09-16 | Discharge: 2021-09-17 | Disposition: A | Discharge status: Home / Self Care | Payer: BC Managed Care – PPO | Attending: Student | Admitting: Student

## 2021-09-16 ENCOUNTER — Other Ambulatory Visit: Payer: Self-pay

## 2021-09-16 DIAGNOSIS — O209 Hemorrhage in early pregnancy, unspecified: Secondary | ICD-10-CM | POA: Diagnosis present

## 2021-09-16 DIAGNOSIS — O4690 Antepartum hemorrhage, unspecified, unspecified trimester: Secondary | ICD-10-CM

## 2021-09-16 DIAGNOSIS — O418X11 Other specified disorders of amniotic fluid and membranes, first trimester, fetus 1: Secondary | ICD-10-CM

## 2021-09-16 DIAGNOSIS — Z3A08 8 weeks gestation of pregnancy: Secondary | ICD-10-CM | POA: Diagnosis not present

## 2021-09-16 NOTE — ED Triage Notes (Signed)
Pt reports she is [redacted] weeks pregnant and began bleeding vaginally after intercourse. Pt reports previous miscarriage.  Pt reports that she is not sure if she is still bleeding.

## 2021-09-17 ENCOUNTER — Emergency Department (HOSPITAL_COMMUNITY): Payer: BC Managed Care – PPO

## 2021-09-17 LAB — COMPREHENSIVE METABOLIC PANEL
ALT: 11 U/L (ref 0–44)
AST: 24 U/L (ref 15–41)
Albumin: 3.4 g/dL — ABNORMAL LOW (ref 3.5–5.0)
Alkaline Phosphatase: 24 U/L — ABNORMAL LOW (ref 38–126)
Anion gap: 7 (ref 5–15)
BUN: 10 mg/dL (ref 6–20)
CO2: 22 mmol/L (ref 22–32)
Calcium: 8.6 mg/dL — ABNORMAL LOW (ref 8.9–10.3)
Chloride: 106 mmol/L (ref 98–111)
Creatinine, Ser: 0.61 mg/dL (ref 0.44–1.00)
GFR, Estimated: >60 mL/min (ref >60)
Glucose, Bld: 104 mg/dL — ABNORMAL HIGH (ref 70–99)
Potassium: 4 mmol/L (ref 3.5–5.1)
Sodium: 135 mmol/L (ref 135–145)
Total Bilirubin: 0.7 mg/dL (ref 0.3–1.2)
Total Protein: 6.7 g/dL (ref 6.5–8.1)

## 2021-09-17 LAB — GC/CHLAMYDIA PROBE AMP (~~LOC~~) NOT AT ARMC
Chlamydia: NEGATIVE
Comment: NEGATIVE
Comment: NORMAL
Neisseria Gonorrhea: NEGATIVE

## 2021-09-17 LAB — CBC WITH DIFFERENTIAL/PLATELET
Abs Immature Granulocytes: 0.02 10*3/uL (ref 0.00–0.07)
Basophils Absolute: 0 10*3/uL (ref 0.0–0.1)
Basophils Relative: 0 %
Eosinophils Absolute: 0.1 10*3/uL (ref 0.0–0.5)
Eosinophils Relative: 2 %
HCT: 32.4 % — ABNORMAL LOW (ref 36.0–46.0)
Hemoglobin: 11.1 g/dL — ABNORMAL LOW (ref 12.0–15.0)
Immature Granulocytes: 0 %
Lymphocytes Relative: 29 %
Lymphs Abs: 1.9 10*3/uL (ref 0.7–4.0)
MCH: 29.3 pg (ref 26.0–34.0)
MCHC: 34.3 g/dL (ref 30.0–36.0)
MCV: 85.5 fL (ref 80.0–100.0)
Monocytes Absolute: 0.7 10*3/uL (ref 0.1–1.0)
Monocytes Relative: 10 %
Neutro Abs: 3.9 10*3/uL (ref 1.7–7.7)
Neutrophils Relative %: 59 %
Platelets: 371 10*3/uL (ref 150–400)
RBC: 3.79 MIL/uL — ABNORMAL LOW (ref 3.87–5.11)
RDW: 13.6 % (ref 11.5–15.5)
WBC: 6.7 10*3/uL (ref 4.0–10.5)
nRBC: 0 % (ref 0.0–0.2)

## 2021-09-17 LAB — WET PREP, GENITAL
Clue Cells Wet Prep HPF POC: NONE SEEN
Sperm: NONE SEEN
Trich, Wet Prep: NONE SEEN
WBC, Wet Prep HPF POC: <10 (ref <10)
Yeast Wet Prep HPF POC: NONE SEEN

## 2021-09-17 LAB — HCG, QUANTITATIVE, PREGNANCY: hCG, Beta Chain, Quant, S: 150261 m[IU]/mL — ABNORMAL HIGH (ref <5)

## 2021-09-17 LAB — I-STAT BETA HCG BLOOD, ED (MC, WL, AP ONLY): I-stat hCG, quantitative: >2000 m[IU]/mL — ABNORMAL HIGH (ref <5)

## 2021-09-17 NOTE — ED Provider Notes (Signed)
[facility]-EMERGENCY DEPT Provider Note  CSN: [id number] Arrival date & time: 09/16/21 2316  Chief Complaint(s) Vaginal Bleeding  HPI Desirea Mizrahi is a 31 y.o. female G2 P1 approximately [redacted] weeks pregnant by LMP who presents emergency department for evaluation of vaginal bleeding in pregnancy.  Patient states that she had an episode of bright red vaginal bleeding after sexual intercourse tonight.  She has no associated abdominal cramping or abdominal pain.  Denies chest pain, shortness of breath, headache, fever, nausea, vomiting or other systemic symptoms.  Denies additional vaginal discharge or dysuria.   Past Medical History History reviewed. No pertinent past medical history. There are no problems to display for this patient.  Home Medication(s) Prior to Admission medications   Medication Sig Start Date End Date Taking? Authorizing Provider  diphenhydrAMINE (BENADRYL) 25 MG tablet Take 1 tablet (25 mg total) by mouth every 6 (six) hours as needed. 06/30/21  Yes Renne Crigler, PA-C  EPINEPHrine 0.3 mg/0.3 mL IJ SOAJ injection Inject 0.3 mg into the muscle as needed for anaphylaxis. 06/30/21  Yes Renne Crigler, PA-C  levothyroxine (SYNTHROID) 125 MCG tablet Take 1 tablet (125 mcg total) by mouth daily before breakfast. 08/24/21 09/23/21 Yes Jeannie Fend, PA-C  erythromycin ophthalmic ointment Place a 1/2 inch ribbon of ointment into the lower eyelid. Patient not taking: Reported on 09/17/2021 07/01/21   Tomi Bamberger, PA-C  predniSONE (DELTASONE) 20 MG tablet Take 2 tablets (40 mg total) by mouth daily. Patient not taking: Reported on 09/17/2021 06/30/21   Renne Crigler, PA-C                                                                                                                                    Past Surgical History History reviewed. No pertinent surgical history. Family History Family History  Problem Relation Age of Onset   Hypertension Father     Diabetes Father     Social History Social History   Tobacco Use   Smoking status: Never   Smokeless tobacco: Never  Substance Use Topics   Alcohol use: Not Currently   Drug use: Not Currently   Allergies Ibuprofen  Review of Systems Review of Systems  Genitourinary:  Positive for vaginal bleeding.   Physical Exam Vital Signs  I have reviewed the triage vital signs BP 130/86   Pulse 82   Temp 98 F (36.7 C) (Oral)   Resp 16   Ht 5' (1.524 m)   Wt 84 kg   SpO2 98%   BMI 36.17 kg/m   Physical Exam Vitals and nursing note reviewed.  Constitutional:      General: She is not in acute distress.    Appearance: She is well-developed.  HENT:     Head: Normocephalic and atraumatic.  Eyes:     Conjunctiva/sclera: Conjunctivae normal.  Cardiovascular:     Rate and Rhythm: Normal rate and regular rhythm.  Heart sounds: No murmur heard. Pulmonary:     Effort: Pulmonary effort is normal. No respiratory distress.     Breath sounds: Normal breath sounds.  Abdominal:     Palpations: Abdomen is soft.     Tenderness: There is no abdominal tenderness.  Genitourinary:    Comments: Os closed, scant bleeding from the cervical os Musculoskeletal:        General: No swelling.     Cervical back: Neck supple.  Skin:    General: Skin is warm and dry.     Capillary Refill: Capillary refill takes less than 2 seconds.  Neurological:     Mental Status: She is alert.  Psychiatric:        Mood and Affect: Mood normal.    ED Results and Treatments Labs (all labs ordered are listed, but only abnormal results are displayed) Labs Reviewed  COMPREHENSIVE METABOLIC PANEL - Abnormal; Notable for the following components:      Result Value   Glucose, Bld 104 (*)    Calcium 8.6 (*)    Albumin 3.4 (*)    Alkaline Phosphatase 24 (*)    All other components within normal limits  CBC WITH DIFFERENTIAL/PLATELET - Abnormal; Notable for the following components:   RBC 3.79 (*)     Hemoglobin 11.1 (*)    HCT 32.4 (*)    All other components within normal limits  HCG, QUANTITATIVE, PREGNANCY - Abnormal; Notable for the following components:   hCG, Beta Chain, Quant, S 150,261 (*)    All other components within normal limits  I-STAT BETA HCG BLOOD, ED ([facility], [facility], [facility] ONLY) - Abnormal; Notable for the following components:   I-stat hCG, quantitative >2,000.0 (*)    All other components within normal limits  WET PREP, GENITAL  TYPE AND SCREEN  GC/CHLAMYDIA PROBE AMP ([place]) NOT AT [facility]                                                                                                                          Radiology US OB Comp < 14 Wks  Result Date: 09/17/2021 CLINICAL DATA:  Vaginal bleeding EXAM: OBSTETRIC <14 WK ULTRASOUND TECHNIQUE: Transabdominal ultrasound was performed for evaluation of the gestation as well as the maternal uterus and adnexal regions. COMPARISON:  None Available. FINDINGS: Intrauterine gestational sac: Single Yolk sac:  Visualized. Embryo:  Visualized. Cardiac Activity: Visualized. Heart Rate: 158 bpm MSD:    mm    w     d CRL:   16.4 mm   8 w 0 d                  [place] EDC: 04/29/2022 Subchorionic hemorrhage:  Small subchorionic hemorrhage Maternal uterus/adnexae: No adnexal mass or free fluid IMPRESSION: 8 week intrauterine pregnancy. Fetal heart rate 158 beats per minute. Small subchorionic hemorrhage. Electronically Signed   By: Charlett Nose M.D.   On: 09/17/2021 02:10    Pertinent labs & imaging results that  were available during my care of the patient were reviewed by me and considered in my medical decision making (see MDM for details).  Medications Ordered in ED Medications - No data to display                                                                                                                                   Procedures Procedures  (including critical care time)  Medical Decision Making / ED Course   This patient presents to  the ED for concern of vaginal bleeding in pregnancy, this involves an extensive number of treatment options, and is a complaint that carries with it a high risk of complications and morbidity.  The differential diagnosis includes threatened miscarriage, spontaneous abortion, inevitable abortion, digital trauma, vaginal laceration, STI  MDM: Seen emergency room for evaluation for evaluation of bleeding and pregnancy.  Physical exam including a pelvic exam revealing a closed cervical os with scant amount of bleeding from the cervical os.  No vaginal lacerations noted.  Laboratory evaluation with a hemoglobin of 11.1, beta quant 150,261.  Transabdominal ultrasound visualizes 1 intrauterine pregnancy with a heart rate of 158 and a small subchorionic hematoma which is likely the source of the patient's bleeding.  I had a long discussion with the patient about this finding and that this may indicate a higher risk for miscarriage but that she is currently not having a miscarriage.  Patient states she will follow-up with her OB/GYN.  Patient currently does not meet inpatient criteria for admission is safe for discharge with outpatient OB/GYN follow-up.   Additional history obtained: -Additional history obtained from partner -External records from outside source obtained and reviewed including: Chart review including previous notes, labs, imaging, consultation notes   Lab Tests: -I ordered, reviewed, and interpreted labs.   The pertinent results include:   Labs Reviewed  COMPREHENSIVE METABOLIC PANEL - Abnormal; Notable for the following components:      Result Value   Glucose, Bld 104 (*)    Calcium 8.6 (*)    Albumin 3.4 (*)    Alkaline Phosphatase 24 (*)    All other components within normal limits  CBC WITH DIFFERENTIAL/PLATELET - Abnormal; Notable for the following components:   RBC 3.79 (*)    Hemoglobin 11.1 (*)    HCT 32.4 (*)    All other components within normal limits  HCG, QUANTITATIVE,  PREGNANCY - Abnormal; Notable for the following components:   hCG, Beta Chain, Quant, S 150,261 (*)    All other components within normal limits  I-STAT BETA HCG BLOOD, ED ([facility], [facility], [facility] ONLY) - Abnormal; Notable for the following components:   I-stat hCG, quantitative >2,000.0 (*)    All other components within normal limits  WET PREP, GENITAL  TYPE AND SCREEN  GC/CHLAMYDIA PROBE AMP ([place]) NOT AT [facility]      Imaging Studies ordered: I ordered imaging studies including US OB  I independently visualized and interpreted imaging. I agree with the radiologist interpretation   Medicines ordered and prescription drug management: No orders of the defined types were placed in this encounter.   -I have reviewed the patients home medicines and have made adjustments as needed  Critical interventions none   Cardiac Monitoring: The patient was maintained on a cardiac monitor.  I personally viewed and interpreted the cardiac monitored which showed an underlying rhythm of: NSR  Social Determinants of Health:  Factors impacting patients care include: none   Reevaluation: After the interventions noted above, I reevaluated the patient and found that they have :stayed the same  Co morbidities that complicate the patient evaluation History reviewed. No pertinent past medical history.    Dispostion: I considered admission for this patient, but her vaginal bleeding pregnancy does not meet inpatient criteria for admission and she is safe for discharge with outpatient OB/GYN follow-up.     Final Clinical Impression(s) / ED Diagnoses Final diagnoses:  Vaginal bleeding in pregnancy  Subchorionic hemorrhage of placenta in first trimester, fetus 1 of multiple gestation     @PCDICTATION @    Lavarr President, , MD 09/17/21 [phone]

## 2021-09-30 ENCOUNTER — Other Ambulatory Visit (HOSPITAL_COMMUNITY)
Admission: RE | Admit: 2021-09-30 | Discharge: 2021-09-30 | Disposition: A | Discharge status: Home / Self Care | Payer: BC Managed Care – PPO | Source: Ambulatory Visit | Attending: Obstetrics and Gynecology | Admitting: Obstetrics and Gynecology

## 2021-09-30 ENCOUNTER — Other Ambulatory Visit: Payer: Self-pay | Admitting: Obstetrics and Gynecology

## 2021-09-30 DIAGNOSIS — Z3490 Encounter for supervision of normal pregnancy, unspecified, unspecified trimester: Secondary | ICD-10-CM | POA: Diagnosis not present

## 2021-10-06 LAB — CYTOLOGY - PAP
Comment: NEGATIVE
Diagnosis: NEGATIVE
High risk HPV: NEGATIVE

## 2022-02-10 LAB — OB RESULTS CONSOLE RPR: RPR: NONREACTIVE

## 2022-03-31 LAB — OB RESULTS CONSOLE GBS: GBS: NEGATIVE

## 2022-04-10 ENCOUNTER — Inpatient Hospital Stay (HOSPITAL_COMMUNITY): Payer: BC Managed Care – PPO

## 2022-04-10 ENCOUNTER — Encounter (HOSPITAL_COMMUNITY): Payer: Self-pay | Admitting: Obstetrics and Gynecology

## 2022-04-10 ENCOUNTER — Inpatient Hospital Stay (HOSPITAL_COMMUNITY)
Admission: AD | Admit: 2022-04-10 | Discharge: 2022-04-12 | DRG: 807 | Disposition: A | Discharge status: Home / Self Care | Payer: BC Managed Care – PPO | Attending: Obstetrics and Gynecology | Admitting: Obstetrics and Gynecology

## 2022-04-10 ENCOUNTER — Inpatient Hospital Stay (HOSPITAL_COMMUNITY): Payer: BC Managed Care – PPO | Admitting: Anesthesiology

## 2022-04-10 DIAGNOSIS — O365930 Maternal care for other known or suspected poor fetal growth, third trimester, not applicable or unspecified: Principal | ICD-10-CM | POA: Diagnosis present

## 2022-04-10 DIAGNOSIS — Z3A37 37 weeks gestation of pregnancy: Secondary | ICD-10-CM

## 2022-04-10 DIAGNOSIS — Z79890 Hormone replacement therapy (postmenopausal): Secondary | ICD-10-CM | POA: Diagnosis not present

## 2022-04-10 DIAGNOSIS — E039 Hypothyroidism, unspecified: Secondary | ICD-10-CM | POA: Diagnosis present

## 2022-04-10 DIAGNOSIS — O365990 Maternal care for other known or suspected poor fetal growth, unspecified trimester, not applicable or unspecified: Secondary | ICD-10-CM | POA: Diagnosis present

## 2022-04-10 DIAGNOSIS — O99284 Endocrine, nutritional and metabolic diseases complicating childbirth: Secondary | ICD-10-CM | POA: Diagnosis present

## 2022-04-10 LAB — CBC
HCT: 30 % — ABNORMAL LOW (ref 36.0–46.0)
Hemoglobin: 9.4 g/dL — ABNORMAL LOW (ref 12.0–15.0)
MCH: 22.4 pg — ABNORMAL LOW (ref 26.0–34.0)
MCHC: 31.3 g/dL (ref 30.0–36.0)
MCV: 71.6 fL — ABNORMAL LOW (ref 80.0–100.0)
Platelets: 372 10*3/uL (ref 150–400)
RBC: 4.19 MIL/uL (ref 3.87–5.11)
RDW: 16.4 % — ABNORMAL HIGH (ref 11.5–15.5)
WBC: 7.9 10*3/uL (ref 4.0–10.5)
nRBC: 0 % (ref 0.0–0.2)

## 2022-04-10 LAB — TYPE AND SCREEN
ABO/RH(D): A POS
Antibody Screen: NEGATIVE

## 2022-04-10 MED ORDER — EPHEDRINE 5 MG/ML INJ: 10.0000 mg | INTRAVENOUS | Status: DC | PRN

## 2022-04-10 MED ORDER — MISOPROSTOL 25 MCG QUARTER TABLET
25.0000 ug | ORAL_TABLET | ORAL | Status: DC
  Administered 2022-04-10 (×2): 25 ug via ORAL
  Filled 2022-04-10 (×2): qty 1

## 2022-04-10 MED ORDER — ONDANSETRON HCL 4 MG/2ML IJ SOLN: 4.0000 mg | Freq: Four times a day (QID) | INTRAMUSCULAR | Status: DC | PRN

## 2022-04-10 MED ORDER — LACTATED RINGERS IV SOLN: 500.0000 mL | INTRAVENOUS | Status: DC | PRN

## 2022-04-10 MED ORDER — FENTANYL-BUPIVACAINE-NACL 0.5-0.125-0.9 MG/250ML-% EP SOLN
12.0000 mL/h | EPIDURAL | Status: DC | PRN
  Administered 2022-04-10: 12 mL/h via EPIDURAL
  Filled 2022-04-10: qty 250

## 2022-04-10 MED ORDER — LEVOTHYROXINE SODIUM 125 MCG PO TABS
125.0000 ug | ORAL_TABLET | Freq: Every day | ORAL | Status: DC
  Filled 2022-04-10: qty 1

## 2022-04-10 MED ORDER — LEVOTHYROXINE SODIUM 25 MCG PO TABS: 25.0000 ug | ORAL_TABLET | Freq: Every day | ORAL | Status: DC

## 2022-04-10 MED ORDER — OXYTOCIN BOLUS FROM INFUSION
333.0000 mL | Freq: Once | INTRAVENOUS | Status: AC
  Administered 2022-04-11: 333 mL via INTRAVENOUS

## 2022-04-10 MED ORDER — ACETAMINOPHEN 325 MG PO TABS: 650.0000 mg | ORAL_TABLET | ORAL | Status: DC | PRN

## 2022-04-10 MED ORDER — PHENYLEPHRINE 80 MCG/ML (10ML) SYRINGE FOR IV PUSH (FOR BLOOD PRESSURE SUPPORT)
80.0000 ug | PREFILLED_SYRINGE | INTRAVENOUS | Status: DC | PRN
  Administered 2022-04-10: 80 ug via INTRAVENOUS

## 2022-04-10 MED ORDER — LACTATED RINGERS IV SOLN: 500.0000 mL | Freq: Once | INTRAVENOUS | Status: DC

## 2022-04-10 MED ORDER — MISOPROSTOL 25 MCG QUARTER TABLET
25.0000 ug | ORAL_TABLET | ORAL | Status: DC
  Administered 2022-04-10 (×2): 25 ug via VAGINAL
  Filled 2022-04-10 (×2): qty 1

## 2022-04-10 MED ORDER — PHENYLEPHRINE 80 MCG/ML (10ML) SYRINGE FOR IV PUSH (FOR BLOOD PRESSURE SUPPORT)
80.0000 ug | PREFILLED_SYRINGE | INTRAVENOUS | Status: DC | PRN
  Filled 2022-04-10: qty 10

## 2022-04-10 MED ORDER — DIPHENHYDRAMINE HCL 50 MG/ML IJ SOLN: 12.5000 mg | INTRAMUSCULAR | Status: DC | PRN

## 2022-04-10 MED ORDER — OXYCODONE-ACETAMINOPHEN 5-325 MG PO TABS: 2.0000 | ORAL_TABLET | ORAL | Status: DC | PRN

## 2022-04-10 MED ORDER — LIDOCAINE HCL (PF) 1 % IJ SOLN: 30.0000 mL | INTRAMUSCULAR | Status: DC | PRN

## 2022-04-10 MED ORDER — OXYTOCIN-SODIUM CHLORIDE 30-0.9 UT/500ML-% IV SOLN
2.5000 [IU]/h | INTRAVENOUS | Status: DC
  Administered 2022-04-11: 2.5 [IU]/h via INTRAVENOUS

## 2022-04-10 MED ORDER — TERBUTALINE SULFATE 1 MG/ML IJ SOLN
0.2500 mg | Freq: Once | INTRAMUSCULAR | Status: AC | PRN
  Administered 2022-04-10: 0.25 mg via SUBCUTANEOUS
  Filled 2022-04-10: qty 1

## 2022-04-10 MED ORDER — SOD CITRATE-CITRIC ACID 500-334 MG/5ML PO SOLN: 30.0000 mL | ORAL | Status: DC | PRN

## 2022-04-10 MED ORDER — HYDROXYZINE HCL 50 MG PO TABS: 50.0000 mg | ORAL_TABLET | Freq: Four times a day (QID) | ORAL | Status: DC | PRN

## 2022-04-10 MED ORDER — OXYTOCIN-SODIUM CHLORIDE 30-0.9 UT/500ML-% IV SOLN
1.0000 m[IU]/min | INTRAVENOUS | Status: DC
  Administered 2022-04-10: 2 m[IU]/min via INTRAVENOUS
  Filled 2022-04-10: qty 500

## 2022-04-10 MED ORDER — FENTANYL CITRATE (PF) 100 MCG/2ML IJ SOLN: 50.0000 ug | INTRAMUSCULAR | Status: DC | PRN

## 2022-04-10 MED ORDER — LACTATED RINGERS IV SOLN
INTRAVENOUS | Status: DC
Start: 2022-04-10 — End: 2022-04-11

## 2022-04-10 MED ORDER — LIDOCAINE HCL (PF) 1 % IJ SOLN
INTRAMUSCULAR | Status: DC | PRN
  Administered 2022-04-10: 11 mL via EPIDURAL

## 2022-04-10 MED ORDER — OXYCODONE-ACETAMINOPHEN 5-325 MG PO TABS: 1.0000 | ORAL_TABLET | ORAL | Status: DC | PRN

## 2022-04-10 NOTE — Progress Notes (Signed)
   Subjective: Pt rates contractions as 3 out of 10. Feels them more when she is sitting up. No LOF no vaginal bleeding.   Objective: BP 112/75   Pulse 88   Temp 97.9 F (36.6 C) (Oral)   Resp 18   Ht 5' (1.524 m)   Wt 93.2 kg   Breastfeeding Unknown   BMI 40.11 kg/m  No intake/output data recorded. No intake/output data recorded.  FHT:  FHR: 140's bpm, variability: moderate,  accelerations:  Present,  decelerations:  Present occasional variable  UC:   regular, every 1-2 minutes SVE:   Dilation: 2 Effacement (%): 50 Station: -3 Exam by:: Richardson Dopp MD  Labs: Lab Results  Component Value Date   WBC 7.9 04/10/2022   HGB 9.4 (L) 04/10/2022   HCT 30.0 (L) 04/10/2022   MCV 71.6 (L) 04/10/2022   PLT 372 04/10/2022    Assessment / Plan: Induction of labor due to IUGR  Labor:  s/p cytotec 25 po / 25 vaginal x 2... plan to start pitocin 2x2  Preeclampsia:   NA Fetal Wellbeing:  Category I and Category II Overall Category 1 Pain Control:  Labor support without medications I/D:  n/a Anticipated MOD:  NSVD [facility] covering after 5 pm   Gerald Leitz, MD 04/10/2022, 5:26 PM

## 2022-04-10 NOTE — Anesthesia Procedure Notes (Signed)
Epidural Patient location during procedure: OB Start time: 04/10/2022 8:00 PM End time: 04/10/2022 8:17 PM  Staffing Anesthesiologist: Lowella Curb, MD Performed: anesthesiologist   Preanesthetic Checklist Completed: patient identified, IV checked, site marked, risks and benefits discussed, surgical consent, monitors and equipment checked, pre-op evaluation and timeout performed  Epidural Patient position: sitting Prep: ChloraPrep Patient monitoring: heart rate, cardiac monitor, continuous pulse ox and blood pressure Approach: midline Location: L2-L3 Injection technique: LOR saline  Needle:  Needle type: Tuohy  Needle gauge: 17 G Needle length: 9 cm Needle insertion depth: 7 cm Catheter type: closed end flexible Catheter size: 20 Guage Catheter at skin depth: 12 cm Test dose: negative  Assessment Events: blood not aspirated, injection not painful, no injection resistance, no paresthesia and negative IV test  Additional Notes Reason for block:procedure for pain

## 2022-04-10 NOTE — H&P (Signed)
Joy Ali is a 31 y.o. female G3P1011 at 37 weeks and 4 days presenting for induction due to IUGR. Pt had an ultrasound in the office yesterday and efw was 5 lbs 8 oz ( 6 %ile)  abdominal circumferense is less than 2%ile.  Pregnancy complicated by hypothyroidism ( on levothyroxine 125 mcg daily)    Prenatal care provided by Dr. Gerald Leitz with [facility]. OB History     Gravida  3   Para  1   Term      Preterm      AB      Living         SAB      IAB      Ectopic      Multiple      Live Births              PMH:  Hypothyroidism - on levothyroxine 125 mcg daily  No past surgical history on file. Family History: family history includes Diabetes in her father; Hypertension in her father. Social History:  reports that she has never smoked. She has never used smokeless tobacco. She reports that she does not currently use alcohol. She reports that she does not currently use drugs.     Maternal Diabetes: No Genetic Screening: Normal Maternal Ultrasounds/Referrals: IUGR Fetal Ultrasounds or other Referrals:  None Maternal Substance Abuse:  No Significant Maternal Medications:  Meds include: Syntroid Significant Maternal Lab Results:  Group B Strep negative Number of Prenatal Visits:greater than 3 verified prenatal visits Other Comments:  None  Review of Systems  Constitutional: Negative.   HENT: Negative.    Eyes: Negative.   Respiratory: Negative.    Cardiovascular: Negative.   Gastrointestinal: Negative.   Endocrine: Negative.   Genitourinary: Negative.   Musculoskeletal: Negative.   Skin: Negative.   Allergic/Immunologic: Negative.   Neurological: Negative.   Hematological: Negative.   Psychiatric/Behavioral: Negative.     History Dilation: 1.5 Effacement (%): 70 Station: -3 Exam by:: fuller, B Blood pressure 115/84, pulse 99, temperature 97.8 F (36.6 C), temperature source Axillary, resp. rate 18, height 5' (1.524 m), weight 93.2 kg,  unknown if currently breastfeeding. Maternal Exam:  Introitus: Normal vulva.   Physical Exam Vitals reviewed.  Constitutional:      Appearance: Normal appearance.  HENT:     Head: Normocephalic and atraumatic.     Nose: Nose normal.     Mouth/Throat:     Mouth: Mucous membranes are moist.  Cardiovascular:     Rate and Rhythm: Normal rate and regular rhythm.  Pulmonary:     Effort: Pulmonary effort is normal.     Breath sounds: Normal breath sounds.  Abdominal:     Tenderness: There is no abdominal tenderness.  Genitourinary:    General: Normal vulva.  Musculoskeletal:        General: No swelling. Normal range of motion.     Cervical back: Normal range of motion and neck supple.  Skin:    General: Skin is warm and dry.  Neurological:     General: No focal deficit present.     Mental Status: She is alert and oriented to person, place, and time.  Psychiatric:        Mood and Affect: Mood normal.        Behavior: Behavior normal.     Prenatal labs: ABO, Rh: --/--/A POS (12/22 0732) Antibody: NEG (12/22 0732) Rubella:  Immune  RPR:   Nonreactive  HBsAg: Negative (05/23 0000)  HIV: Non-reactive (05/23 0000)  GBS:   Negative on 03/31/2022   Assessment/Plan 37 weeks and 4 days with IUGR  - admit to labor and delivery for induction.  - cytotec 25 mcg po and 25 mcg pv q 4 hours for induction.  - hypothyroidism  - continue levothyroxine 125 mcg daily  - Category 1 fetal tracing  - Anticipate SVD    Gerald Leitz 04/10/2022, 1:40 PM

## 2022-04-10 NOTE — Anesthesia Preprocedure Evaluation (Signed)
Anesthesia Evaluation  Patient identified by MRN, date of birth, ID band Patient awake    Reviewed: Allergy & Precautions, H&P , NPO status , Patient's Chart, lab work & pertinent test results  Airway Mallampati: II  TM Distance: >3 FB Neck ROM: Full    Dental no notable dental hx.    Pulmonary neg pulmonary ROS   Pulmonary exam normal breath sounds clear to auscultation       Cardiovascular negative cardio ROS Normal cardiovascular exam Rhythm:Regular Rate:Normal     Neuro/Psych negative neurological ROS  negative psych ROS   GI/Hepatic negative GI ROS, Neg liver ROS,,,  Endo/Other  negative endocrine ROS    Renal/GU negative Renal ROS  negative genitourinary   Musculoskeletal negative musculoskeletal ROS (+)    Abdominal  (+) + obese  Peds negative pediatric ROS (+)  Hematology negative hematology ROS (+)   Anesthesia Other Findings   Reproductive/Obstetrics (+) Pregnancy                             Anesthesia Physical Anesthesia Plan  ASA: 3  Anesthesia Plan: Epidural   Post-op Pain Management:    Induction:   PONV Risk Score and Plan:   Airway Management Planned:   Additional Equipment:   Intra-op Plan:   Post-operative Plan:   Informed Consent:   Plan Discussed with:   Anesthesia Plan Comments:        Anesthesia Quick Evaluation

## 2022-04-10 NOTE — Progress Notes (Signed)
Joy Ali is a 31 y.o. G3P1 at .[redacted]w[redacted]d   Subjective: Called to the patient's room by the RN. Patient's OB provider is in the OR and unable to come to the bedside at the moment. Patient having a prolonged deceleration. She just got epidural. She has been repositioned, pitocin off, 1 dose of phenylephrine and LR bolus going. RN reports that prior to epidural patient was having some late decelerations.   Objective: BP 112/75   Pulse 88   Temp 97.9 F (36.6 C) (Oral)   Resp 18   Ht 5' (1.524 m)   Wt 93.2 kg   Breastfeeding Unknown   BMI 40.11 kg/m  No intake/output data recorded. No intake/output data recorded.  FHT:  140, moderate variability, accelerations not assessed while I was in the room. Prolonged deceleration with a nadir of 80bpm x 2 mins, then back to baseline, then back to 80 x 1 mins then back to baseline, then back to 80 x 5 mins. I arrived as it was returning to baseline.  UC:   toco not on while patient was being repositioned.  SVE:   Dilation: 4 Effacement (%): 100 Station: -1 Exam by:: Tiara Bartoli,cnm  Verbal consent obtained from the patient. IFSE and IUPC placed. FHR at baseline now, but with late decelerations noted after IUPC was tracing contractions.   1 dose of terbutaline given.   Labs: Lab Results  Component Value Date   WBC 7.9 04/10/2022   HGB 9.4 (L) 04/10/2022   HCT 30.0 (L) 04/10/2022   MCV 71.6 (L) 04/10/2022   PLT 372 04/10/2022    Assessment / Plan: Prolonged FHR deceleration  IUPC and IFSE palced Terbutaline given  Prior to my arrival patient had been repositioned, pitocin off, LR bolus started and phenylephrine given.   RN aware I am available for further assistance as needed  Will defer to patient's OB team for further management   Thressa Sheller DNP, CNM  04/10/22  8:54 PM

## 2022-04-10 NOTE — Progress Notes (Signed)
Subjective:    Comfortable with the epidural. Cooperating with position changes for FHR. Agrees to using peanut ball to facilitate decent.   Objective:    VS: BP (!) 103/44   Pulse (!) 117   Temp 98.3 F (36.8 C) (Oral)   Resp 18   Ht 5' (1.524 m)   Wt 93.2 kg   SpO2 100%   Breastfeeding Unknown   BMI 40.11 kg/m  FHR : baseline 140 / variability moderate / accelerations absent / late decelerations Toco: contractions every 5-7 minutes / MVU 80-120 Membranes: SROM , remains clear Dilation: 6 Effacement (%): 100 Cervical Position: Posterior Station: -1 Presentation: Vertex Exam by:: Andrey Campanile, RN Pitocin off  Assessment/Plan:   31 y.o. G3P1 [redacted]w[redacted]d IOL for IUGR  Labor: Progressing normally Preeclampsia:   normotensive to mild range, neg neuro symptoms Fetal Wellbeing:  Category II Pain Control:  Epidural I/D:   GBS neg Anticipated MOD:  NSVD  [name] DNP, CNM 04/10/2022 10:33 PM

## 2022-04-11 ENCOUNTER — Encounter (HOSPITAL_COMMUNITY): Payer: Self-pay | Admitting: Obstetrics and Gynecology

## 2022-04-11 LAB — CBC
HCT: 26.2 % — ABNORMAL LOW (ref 36.0–46.0)
Hemoglobin: 8 g/dL — ABNORMAL LOW (ref 12.0–15.0)
MCH: 22.1 pg — ABNORMAL LOW (ref 26.0–34.0)
MCHC: 30.5 g/dL (ref 30.0–36.0)
MCV: 72.4 fL — ABNORMAL LOW (ref 80.0–100.0)
Platelets: 345 10*3/uL (ref 150–400)
RBC: 3.62 MIL/uL — ABNORMAL LOW (ref 3.87–5.11)
RDW: 16.5 % — ABNORMAL HIGH (ref 11.5–15.5)
WBC: 13.7 10*3/uL — ABNORMAL HIGH (ref 4.0–10.5)
nRBC: 0 % (ref 0.0–0.2)

## 2022-04-11 LAB — RPR: RPR Ser Ql: NONREACTIVE

## 2022-04-11 MED ORDER — SENNOSIDES-DOCUSATE SODIUM 8.6-50 MG PO TABS
2.0000 | ORAL_TABLET | ORAL | Status: DC
  Administered 2022-04-12: 2 via ORAL
  Filled 2022-04-11: qty 2

## 2022-04-11 MED ORDER — WITCH HAZEL-GLYCERIN EX PADS: 1.0000 | MEDICATED_PAD | CUTANEOUS | Status: DC | PRN

## 2022-04-11 MED ORDER — DIBUCAINE (PERIANAL) 1 % EX OINT: 1.0000 | TOPICAL_OINTMENT | CUTANEOUS | Status: DC | PRN

## 2022-04-11 MED ORDER — TETANUS-DIPHTH-ACELL PERTUSSIS 5-2.5-18.5 LF-MCG/0.5 IM SUSY: 0.5000 mL | PREFILLED_SYRINGE | Freq: Once | INTRAMUSCULAR | Status: DC

## 2022-04-11 MED ORDER — TRANEXAMIC ACID-NACL 1000-0.7 MG/100ML-% IV SOLN
1000.0000 mg | Freq: Once | INTRAVENOUS | Status: AC
  Administered 2022-04-11: 1000 mg via INTRAVENOUS

## 2022-04-11 MED ORDER — BENZOCAINE-MENTHOL 20-0.5 % EX AERO: 1.0000 | INHALATION_SPRAY | CUTANEOUS | Status: DC | PRN

## 2022-04-11 MED ORDER — COCONUT OIL OIL: 1.0000 | TOPICAL_OIL | Status: DC | PRN

## 2022-04-11 MED ORDER — SIMETHICONE 80 MG PO CHEW: 80.0000 mg | CHEWABLE_TABLET | ORAL | Status: DC | PRN

## 2022-04-11 MED ORDER — PRENATAL MULTIVITAMIN CH
1.0000 | ORAL_TABLET | Freq: Every day | ORAL | Status: DC
  Administered 2022-04-12: 1 via ORAL
  Filled 2022-04-11: qty 1

## 2022-04-11 MED ORDER — DIPHENHYDRAMINE HCL 25 MG PO CAPS: 25.0000 mg | ORAL_CAPSULE | Freq: Four times a day (QID) | ORAL | Status: DC | PRN

## 2022-04-11 MED ORDER — TRANEXAMIC ACID-NACL 1000-0.7 MG/100ML-% IV SOLN
INTRAVENOUS | Status: AC
  Filled 2022-04-11: qty 100

## 2022-04-11 MED ORDER — ONDANSETRON HCL 4 MG/2ML IJ SOLN: 4.0000 mg | INTRAMUSCULAR | Status: DC | PRN

## 2022-04-11 MED ORDER — ONDANSETRON HCL 4 MG PO TABS: 4.0000 mg | ORAL_TABLET | ORAL | Status: DC | PRN

## 2022-04-11 MED ORDER — ACETAMINOPHEN 325 MG PO TABS
650.0000 mg | ORAL_TABLET | ORAL | Status: DC | PRN
  Administered 2022-04-11 – 2022-04-12 (×6): 650 mg via ORAL
  Filled 2022-04-11 (×6): qty 2

## 2022-04-11 MED ORDER — LEVOTHYROXINE SODIUM 75 MCG PO TABS
125.0000 ug | ORAL_TABLET | Freq: Every day | ORAL | Status: DC
  Administered 2022-04-11 – 2022-04-12 (×2): 125 ug via ORAL
  Filled 2022-04-11: qty 1

## 2022-04-11 NOTE — Progress Notes (Signed)
Post Partum Day 0 Subjective: no complaints, up ad lib, voiding, and tolerating PO  Objective: Blood pressure 122/81, pulse 96, temperature 98.4 F (36.9 C), resp. rate 18, height 5' (1.524 m), weight 93.2 kg, SpO2 100 %, unknown if currently breastfeeding.  Physical Exam:  General: alert, cooperative, and no distress Lochia: appropriate Uterine Fundus: firm Incision: NA DVT Evaluation: No evidence of DVT seen on physical exam.  Recent Labs    04/10/22 0732 04/11/22 0533  HGB 9.4* 8.0*  HCT 30.0* 26.2*    Assessment/Plan: Plan for discharge tomorrow, Breastfeeding, and Lactation consult Desires elective circumcision of newborn prior to discharge. R/B/A discussed.  Probably discharge on PPD#1  LOS: 1 day   Gerald Leitz, MD 04/11/2022, 7:07 PM

## 2022-04-11 NOTE — Anesthesia Postprocedure Evaluation (Signed)
Anesthesia Post Note  Patient: Joy Ali  Procedure(s) Performed: AN AD HOC LABOR EPIDURAL     Patient location during evaluation: Mother Baby Anesthesia Type: Epidural Level of consciousness: awake and alert Pain management: pain level controlled Vital Signs Assessment: post-procedure vital signs reviewed and stable Respiratory status: spontaneous breathing, nonlabored ventilation and respiratory function stable Cardiovascular status: stable Postop Assessment: no headache, no backache and epidural receding Anesthetic complications: no   No notable events documented.  Last Vitals:  Vitals:   04/11/22 0454 04/11/22 0852  BP: 125/69 111/75  Pulse: 87 87  Resp: 16 18  Temp: 37.3 C   SpO2:      Last Pain:  Vitals:   04/11/22 1004  TempSrc:   PainSc: 0-No pain   Pain Goal:                   [name]

## 2022-04-12 MED ORDER — LIDOCAINE 1% INJECTION FOR CIRCUMCISION: 0.8000 mL | INJECTION | Freq: Once | INTRAVENOUS | Status: DC

## 2022-04-12 MED ORDER — SUCROSE 24% NICU/PEDS ORAL SOLUTION: 0.5000 mL | OROMUCOSAL | Status: DC | PRN

## 2022-04-12 MED ORDER — WHITE PETROLATUM EX OINT: 1.0000 | TOPICAL_OINTMENT | CUTANEOUS | Status: DC | PRN

## 2022-04-12 MED ORDER — EPINEPHRINE TOPICAL FOR CIRCUMCISION 0.1 MG/ML: 1.0000 [drp] | TOPICAL | Status: DC | PRN

## 2022-04-12 MED ORDER — GELATIN ABSORBABLE 12-7 MM EX MISC: 1.0000 | Freq: Once | CUTANEOUS | Status: DC | PRN

## 2022-04-12 NOTE — Lactation Note (Signed)
This note was copied from a baby's chart. Lactation Consultation Note  Patient Name: Joy Ali Date: 04/12/2022 Reason for consult: Follow-up assessment;Primapara;1st time breastfeeding;Early term 37-38.6wks;Infant weight loss;Breastfeeding assistance (4.08% [facility]) Age:31 hours  LC entered the room and the infant was asleep in the bassinet.  The birth parent stated that she had no concerns.  LC reviewed engorgement, breast care, warning signs, infant I/O, and mastitis.   Infant Feeding Plan:  Pump every 3 hours and feed the infant the expressed milk.  Watch infant output and call the pediatrician with concerns.  Call outpatient [facility] for assistance with breastfeeding.   Maternal Data Has patient been taught Hand Expression?: Yes Does the patient have breastfeeding experience prior to this delivery?: No  Feeding Nipple Type: Slow - flow   Lactation Tools Discussed/Used Reason for Pumping: Birth parent's choice Pumping frequency: q3hrs  Interventions Interventions: Education  Discharge Discharge Education: Engorgement and breast care;Warning signs for feeding baby;Outpatient recommendation Pump: Personal;DEBP  Consult Status Consult Status: Complete Date: 04/12/22 Follow-up type: Call as needed    Delene Loll 04/12/2022, 12:17 PM

## 2022-04-12 NOTE — Discharge Summary (Signed)
Obstetric Discharge Summary Reason for Admission: induction of labor Prenatal Procedures: none Intrapartum Procedures: spontaneous vaginal delivery Postpartum Procedures: none Complications-Operative and Postpartum: none Hemoglobin  Date Value Ref Range Status  04/11/2022 8.0 (L) 12.0 - 15.0 g/dL Final    Comment:    Reticulocyte Hemoglobin testing may be clinically indicated, consider ordering this additional test [id number]    HCT  Date Value Ref Range Status  04/11/2022 26.2 (L) 36.0 - 46.0 % Final    Physical Exam:  General: alert, cooperative, and no distress Lochia: appropriate Uterine Fundus: firm Incision: n/a DVT Evaluation: No evidence of DVT seen on physical exam.  Discharge Diagnoses: Term Pregnancy-delivered  Discharge Information: Date: 04/12/2022 Activity: pelvic rest Diet: routine Medications: None Condition: stable Instructions: refer to practice specific booklet Discharge to: home  Follow-up Information     Gerald Leitz, MD. Schedule an appointment as soon as possible for a visit in 6 week(s).   Specialty: Obstetrics and Gynecology Contact information: [address] [place] [address] [phone]                 Newborn Data: Live born female  Birth Weight: 6 lb 3.5 oz (2820 g) APGAR: 9, 10  Newborn Delivery   Birth date/time: 04/11/2022 01:14:39 Delivery type: Vaginal, Spontaneous     S/p circumcision 04/12/2022  Home with mother.  Joy Ali 04/12/2022, 10:56 AM

## 2022-04-12 NOTE — Lactation Note (Addendum)
This note was copied from a baby's chart. Lactation Consultation Note  Patient Name: Joy Ali Date: 04/12/2022 Reason for consult: Initial assessment;Primapara;1st time breastfeeding;Exclusive pumping and bottle feeding;Early term 37-38.6wks;Infant weight loss;Breastfeeding assistance;Maternal endocrine disorder (4.08% WL) Age:31 hours  LC entered the room and the infant was in the bassinet.  Per the birth parent, things have been going well.  She stated that she has decided to exclusively pump.  The birth parent commented that she did not feel comfortable with positioning the infant to the breast.  LC asked the birth parent if she would like assistance with positioning.  She remarked that she would rather pump.  The birth parent stated that she was "only getting a little bit of the yellow stuff" when pumping.  She prefers to pump using her personal breast pump.  LC educated the birth parent on colostrum and spoke with her about milk production & supply and demand.  LC reviewed the milk storage guidelines with the birth parent as well.  The birth parent was given a hand out on milk storage and supplementation.  LC shared the information for our outpatient services brochure.  The birth parent had no further questions or concerns.  LC congratulated the birth parent, put her name on the board, and exited the room.   Infant Feeding Plan:  Pump every 3 hours.  Feed expressed milk to the infant via a bottle Supplement as needed.  Call RN/LC for assistance with breastfeeding.   Maternal Data Has patient been taught Hand Expression?: Yes Does the patient have breastfeeding experience prior to this delivery?: No  Feeding    LATCH Score                    Lactation Tools Discussed/Used    Interventions Interventions: Breast feeding basics reviewed;Education;LC Services brochure  Discharge Pump: Personal;DEBP  Consult Status Consult Status:  Follow-up Date: 04/13/22 Follow-up type: In-patient    Orvil Feil Johnathon Mittal 04/12/2022, 9:15 AM

## 2022-04-12 NOTE — Lactation Note (Signed)
This note was copied from a baby's chart. Lactation Consultation Note  Patient Name: Boy Maddelynn Moosman [id number] Date: 04/12/2022   Age:31 hours  LC attempted to visit with the birth parent, but the RN was in the room. Lactation will follow-up later.   Maternal Data    Feeding    LATCH Score                    Lactation Tools Discussed/Used    Interventions    Discharge    Consult Status      Brenen Beigel P Georgena Weisheit 04/12/2022, 8:00 AM

## 2022-04-12 NOTE — Lactation Note (Deleted)
This note was copied from a baby's chart. Lactation Consultation Note  Patient Name: Joy Ali [id number] Date: 04/12/2022 Reason for consult: Follow-up assessment;Primapara;1st time breastfeeding;Early term 37-38.6wks;Infant weight loss;Breastfeeding assistance (4.08% WL) Age:31 hours  LC entered the room and the infant was asleep in the bassinet.  Per the birth parent, things are going well.  She stated that she was having issues with getting comfortable with breastfeeding positions.  The birth parent said that it was her current choice to pump and bottle feed.  LC spoke with the birth parent about supply and demand and pumping frequency.  LC reviewed milk storage guidelines and outpatient services.  The birth parent had no further questions or concerns.   Infant Feeding Plan: Pump every 3 hours and feed expressed milk to the infant.  Supplement as needed.  Call RN/LC for assistance with breastfeeding.   Maternal Data Has patient been taught Hand Expression?: Yes Does the patient have breastfeeding experience prior to this delivery?: No  Feeding Nipple Type: Slow - flow  LATCH Score                    Lactation Tools Discussed/Used Reason for Pumping: Birth parent's choice Pumping frequency: q3hrs  Interventions Interventions: Education  Discharge Discharge Education: Engorgement and breast care;Warning signs for feeding baby;Outpatient recommendation Pump: Personal;DEBP  Consult Status Consult Status: Complete Date: 04/12/22 Follow-up type: Call as needed    Delene Loll 04/12/2022, 12:21 PM

## 2022-04-15 LAB — SURGICAL PATHOLOGY

## 2022-04-21 ENCOUNTER — Telehealth (HOSPITAL_COMMUNITY): Payer: Self-pay | Admitting: *Deleted

## 2022-04-21 NOTE — Telephone Encounter (Signed)
Left phone voicemail message.  Odis Hollingshead, RN 04-21-2022 at 11:01am

## 2022-08-29 ENCOUNTER — Encounter (HOSPITAL_COMMUNITY): Payer: Self-pay | Admitting: *Deleted

## 2022-08-29 ENCOUNTER — Other Ambulatory Visit: Payer: Self-pay

## 2022-08-29 ENCOUNTER — Emergency Department (HOSPITAL_COMMUNITY)
Admission: EM | Admit: 2022-08-29 | Discharge: 2022-08-30 | Disposition: A | Discharge status: Home / Self Care | Payer: BC Managed Care – PPO | Attending: Emergency Medicine | Admitting: Emergency Medicine

## 2022-08-29 DIAGNOSIS — S6992XA Unspecified injury of left wrist, hand and finger(s), initial encounter: Secondary | ICD-10-CM | POA: Diagnosis present

## 2022-08-29 DIAGNOSIS — W260XXA Contact with knife, initial encounter: Secondary | ICD-10-CM | POA: Diagnosis not present

## 2022-08-29 DIAGNOSIS — S61412A Laceration without foreign body of left hand, initial encounter: Secondary | ICD-10-CM | POA: Insufficient documentation
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
## 2022-08-30 DIAGNOSIS — S61412A Laceration without foreign body of left hand, initial encounter: Secondary | ICD-10-CM | POA: Diagnosis not present

## 2022-08-30 MED ORDER — ACETAMINOPHEN 500 MG PO TABS
1000.0000 mg | ORAL_TABLET | ORAL | Status: AC
  Administered 2022-08-30: 1000 mg via ORAL
  Filled 2022-08-30: qty 2

## 2022-08-30 MED ORDER — BUPIVACAINE HCL (PF) 0.5 % IJ SOLN
10.0000 mL | Freq: Once | INTRAMUSCULAR | Status: AC
  Administered 2022-08-30: 10 mL
  Filled 2022-08-30: qty 10

## 2022-08-30 MED ORDER — BACITRACIN ZINC 500 UNIT/GM EX OINT
TOPICAL_OINTMENT | CUTANEOUS | Status: AC
  Administered 2022-08-30: 1 via TOPICAL
  Filled 2022-08-30: qty 0.9

## 2022-08-30 NOTE — Discharge Instructions (Signed)
Sutured repair Keep the laceration site dry for the next 24 hours and leave the dressing in place. After 24 hours you may remove the dressing and gently clean the laceration site with antibacterial soap and warm water. Do not scrub the area. Do not soak the area and water for long periods of time. Don't use hydrogen peroxide, iodine-based solutions, or alcohol, which can slow healing, and will probably be painful! Apply topical bacitracin 1-2 times per day for the next 3-5 days. Return to the emergency department in ~10 days days for removal of the sutures.  You should return sooner for any signs of infection which would include increased redness around the wound, increased swelling, new drainage of yellow pus.
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
## 2022-09-03 ENCOUNTER — Encounter: Payer: Self-pay | Admitting: Emergency Medicine

## 2022-09-03 ENCOUNTER — Ambulatory Visit
Admission: EM | Admit: 2022-09-03 | Discharge: 2022-09-03 | Disposition: A | Discharge status: Home / Self Care | Payer: BC Managed Care – PPO | Attending: Family Medicine | Admitting: Family Medicine

## 2022-09-03 DIAGNOSIS — Z5189 Encounter for other specified aftercare: Secondary | ICD-10-CM

## 2022-09-03 DIAGNOSIS — S61412D Laceration without foreign body of left hand, subsequent encounter: Secondary | ICD-10-CM | POA: Diagnosis not present

## 2022-09-03 HISTORY — DX: Disorder of thyroid, unspecified: E07.9

## 2022-09-03 NOTE — ED Provider Notes (Signed)
[facility]    CSN: [id number] Arrival date & time: 09/03/22  1515      History   Chief Complaint No chief complaint on file.   HPI Joy Ali is a 32 y.o. female.   Patient is here for wound check.  She had a laceration to the left hand with sutures placed 5 days ago (08/29/22).   Yesterday while at work (she does a lot of lifting) she noted that an area of the wound opened a bit and started to bleeding.  No bleeding today and it looks better than yesterday.  She would like to know when to return to work and when to have the suture removed.       No past medical history on file.  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  OB History     Gravida  3   Para  2   Term  1   Preterm      AB      Living  1      SAB      IAB      Ectopic      Multiple  0   Live Births  1            Home Medications    Prior to Admission medications   Medication Sig Start Date End Date Taking? Authorizing Provider  acetaminophen (TYLENOL) 500 MG tablet Take 1,000 mg by mouth as needed for moderate pain.    [provider]  EPINEPHrine 0.3 mg/0.3 mL IJ SOAJ injection Inject 0.3 mg into the muscle as needed for anaphylaxis. 06/30/21   Renne Crigler, PA-C  levothyroxine (SYNTHROID) 125 MCG tablet Take 1 tablet (125 mcg total) by mouth daily before breakfast. 08/24/21 04/11/22  Jeannie Fend, PA-C  Prenatal Vit-Fe Fumarate-FA (PRENATAL PO) Take 2 tablets by mouth daily.    [provider]    Family History Family History  Problem Relation Age of Onset   Hypertension Father    Diabetes Father     Social History Social History   Tobacco Use   Smoking status: Never   Smokeless tobacco: Never  Substance Use Topics   Alcohol use: Not Currently   Drug use: Not Currently     Allergies    Ibuprofen   Review of Systems Review of Systems  Constitutional: Negative.   HENT: Negative.    Respiratory: Negative.    Cardiovascular: Negative.   Gastrointestinal: Negative.   Skin:  Positive for wound.     Physical Exam Triage Vital Signs ED Triage Vitals  Enc Vitals Group     BP      Pulse      Resp      Temp      Temp src      SpO2      Weight      Height      Head Circumference      Peak Flow      Pain Score      Pain Loc      Pain Edu?      Excl. in GC?    No data found.  Updated Vital Signs LMP 08/21/2022   Visual Acuity Right Eye Distance:   Left Eye Distance:   Bilateral Distance:    Right Eye Near:   Left Eye  Near:    Bilateral Near:     Physical Exam Constitutional:      Appearance: Normal appearance.  Skin:    Comments: Laceration across the left palm with 5 sutures in place;  wound is not gaping;  there is slight crusting to a mid portion of the laceration that had opened, but no drainage or bleeding noted at this time.  Wound appears well approximated.   Neurological:     Mental Status: She is alert.      UC Treatments / Results  Labs (all labs ordered are listed, but only abnormal results are displayed) Labs Reviewed - No data to display  EKG   Radiology No results found.  Procedures Procedures (including critical care time)  Medications Ordered in UC Medications - No data to display  Initial Impression / Assessment and Plan / UC Course  I have reviewed the triage vital signs and the nursing notes.  Pertinent labs & imaging results that were available during my care of the patient were reviewed by me and considered in my medical decision making (see chart for details).  Final Clinical Impressions(s) / UC Diagnoses   Final diagnoses:  Visit for wound check  Laceration of left hand without foreign body, subsequent encounter     Discharge Instructions      You were seen today for follow up for the laceration.   It overall looks okay today.  Please stay out of work until Monday (paperwork given).  Please return on Sunday for suture removal, and they can re-evaluate return to work at that time.     ED Prescriptions   None    PDMP not reviewed this encounter.   Jannifer Franklin, MD 09/03/22 [phone]

## 2022-09-03 NOTE — ED Triage Notes (Signed)
Patient presents to Urgent Care with complaints of wound check since 5 days ago. Patient reports getting stitches on palm of left hand. She has noticed the area/wound did open up yesterday. The stitches are larger than the wound. She wanted to have the area checked and give an note for when to return back to work. Denies any pain now but did have some pain when the area opened up yesterday.  She is keeping the area clean and dry.

## 2022-09-03 NOTE — Discharge Instructions (Signed)
You were seen today for follow up for the laceration.  It overall looks okay today.  Please stay out of work until Monday (paperwork given).  Please return on Sunday for suture removal, and they can re-evaluate return to work at that time.

## 2022-09-06 ENCOUNTER — Ambulatory Visit: Payer: BC Managed Care – PPO

## 2023-01-28 ENCOUNTER — Other Ambulatory Visit: Payer: Self-pay

## 2023-01-28 ENCOUNTER — Ambulatory Visit
Admission: EM | Admit: 2023-01-28 | Discharge: 2023-01-28 | Disposition: A | Discharge status: Home / Self Care | Payer: BC Managed Care – PPO | Attending: Internal Medicine | Admitting: Internal Medicine

## 2023-01-28 ENCOUNTER — Encounter: Payer: Self-pay | Admitting: *Deleted

## 2023-01-28 DIAGNOSIS — K0889 Other specified disorders of teeth and supporting structures: Secondary | ICD-10-CM

## 2023-01-28 DIAGNOSIS — K047 Periapical abscess without sinus: Secondary | ICD-10-CM | POA: Diagnosis not present

## 2023-01-28 MED ORDER — AMOXICILLIN-POT CLAVULANATE 875-125 MG PO TABS: 1.0000 | ORAL_TABLET | Freq: Two times a day (BID) | ORAL | 0 refills | Status: DC

## 2023-01-28 MED ORDER — LIDOCAINE VISCOUS HCL 2 % MT SOLN: 15.0000 mL | Freq: Four times a day (QID) | OROMUCOSAL | 0 refills | Status: DC | PRN

## 2023-01-28 NOTE — Discharge Instructions (Signed)
I have prescribed an antibiotic for dental infection.  Keep scheduled appointment with dentist.

## 2023-01-28 NOTE — ED Triage Notes (Signed)
Pt reports having a dental abscess with swelling to Lt side of face. Pt has a appt. With a dentist tomorrow but wants something for pain and a anti-bx.

## 2023-01-28 NOTE — ED Triage Notes (Signed)
Pt called to come in for room.

## 2023-01-28 NOTE — ED Provider Notes (Signed)
[facility]    CSN: [id number] Arrival date & time: 01/28/23  1343      History   Chief Complaint Chief Complaint  Patient presents with   Dental Problem    HPI Joy Ali is a 32 y.o. female.   Patient presents with concerns of dental infection as she has been having left upper dental pain for a few days.  She states that she has an appointment with a dentist tomorrow.  She has not taken anything for her pain.  Denies any fever.     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 No pertinent surgical history.  OB History     Gravida  3   Para  2   Term  1   Preterm      AB      Living  1      SAB      IAB      Ectopic      Multiple  0   Live Births  1            Home Medications    Prior to Admission medications   Medication Sig Start Date End Date Taking? Authorizing Provider  amoxicillin-clavulanate (AUGMENTIN) 875-125 MG tablet Take 1 tablet by mouth every 12 (twelve) hours. 01/28/23  Yes Kajah Santizo, Rolly Salter E, FNP  lidocaine (XYLOCAINE) 2 % solution Use as directed 15 mLs in the mouth or throat every 6 (six) hours as needed for mouth pain. May apply directly to affected area as needed. 01/28/23  Yes Selena Swaminathan, Rolly Salter E, FNP  acetaminophen (TYLENOL) 500 MG tablet Take 1,000 mg by mouth as needed for moderate pain.    [provider]  EPINEPHrine 0.3 mg/0.3 mL IJ SOAJ injection Inject 0.3 mg into the muscle as needed for anaphylaxis. 06/30/21   Renne Crigler, PA-C  levothyroxine (SYNTHROID) 125 MCG tablet Take 1 tablet (125 mcg total) by mouth daily before breakfast. 08/24/21 04/11/22  Jeannie Fend, PA-C  Prenatal Vit-Fe Fumarate-FA (PRENATAL PO) Take 2 tablets by mouth daily.    [provider]    Family History Family History  Problem Relation  Age of Onset   Hypertension Father    Diabetes Father     Social History Social History   Tobacco Use   Smoking status: Never   Smokeless tobacco: Never  Substance Use Topics   Alcohol use: Not Currently   Drug use: Not Currently     Allergies   Ibuprofen   Review of Systems Review of Systems Per HPI  Physical Exam Triage Vital Signs ED Triage Vitals  Encounter Vitals Group     BP 01/28/23 1525 120/85     Systolic BP Percentile --      Diastolic BP Percentile --      Pulse Rate 01/28/23 1525 93     Resp 01/28/23 1525 18     Temp 01/28/23 1525 98.3 F (36.8 C)     Temp src --      SpO2 01/28/23 1525 93 %     Weight --      Height --      Head Circumference --      Peak Flow --      Pain Score 01/28/23 1523 9     Pain Loc --  Pain Education --      Exclude from Growth Chart --    No data found.  Updated Vital Signs BP 120/85   Pulse 93   Temp 98.3 F (36.8 C)   Resp 18   LMP 01/02/2023   SpO2 93%   Visual Acuity Right Eye Distance:   Left Eye Distance:   Bilateral Distance:    Right Eye Near:   Left Eye Near:    Bilateral Near:     Physical Exam Constitutional:      General: She is not in acute distress.    Appearance: Normal appearance. She is not toxic-appearing or diaphoretic.  HENT:     Head: Normocephalic and atraumatic.     Mouth/Throat:     Comments: Patient has mild gingival swelling and erythema that is mild present to the left upper back dentition that slightly extends into the left inner gum. Eyes:     Extraocular Movements: Extraocular movements intact.     Conjunctiva/sclera: Conjunctivae normal.  Pulmonary:     Effort: Pulmonary effort is normal.  Neurological:     General: No focal deficit present.     Mental Status: She is alert and oriented to person, place, and time. Mental status is at baseline.  Psychiatric:        Mood and Affect: Mood normal.        Behavior: Behavior normal.        Thought Content: Thought  content normal.        Judgment: Judgment normal.      UC Treatments / Results  Labs (all labs ordered are listed, but only abnormal results are displayed) Labs Reviewed - No data to display  EKG   Radiology No results found.  Procedures Procedures (including critical care time)  Medications Ordered in UC Medications - No data to display  Initial Impression / Assessment and Plan / UC Course  I have reviewed the triage vital signs and the nursing notes.  Pertinent labs & imaging results that were available during my care of the patient were reviewed by me and considered in my medical decision making (see chart for details).     Physical exam is concerning for dental infection.  Will treat with Augmentin antibiotic.  She is allergic to NSAIDs so IM Toradol deferred.  Advised Tylenol and will prescribe viscous lidocaine to help alleviate pain.  Patient advised to keep scheduled appointment with dentist for follow-up.  Patient verbalized understanding and was agreeable with plan.  Patient's oxygen saturation reading at 93 percent in triage but this is most likely not accurate given recent difficulty with machinery not functioning properly. Patient is is no acute distress so do not think it is accurate.  Final Clinical Impressions(s) / UC Diagnoses   Final diagnoses:  Dental infection     Discharge Instructions      I have prescribed an antibiotic for dental infection.  Keep scheduled appointment with dentist.    ED Prescriptions     Medication Sig Dispense Auth. Provider   amoxicillin-clavulanate (AUGMENTIN) 875-125 MG tablet Take 1 tablet by mouth every 12 (twelve) hours. 14 tablet [place], [place] E, [place]   lidocaine (XYLOCAINE) 2 % solution Use as directed 15 mLs in the mouth or throat every 6 (six) hours as needed for mouth pain. May apply directly to affected area as needed. 100 mL Gustavus Bryant, [place]      PDMP not reviewed this encounter.   Gustavus Bryant,  [place] 01/28/23  1540  

## 2023-02-16 DIAGNOSIS — L309 Dermatitis, unspecified: Secondary | ICD-10-CM | POA: Insufficient documentation

## 2023-02-16 DIAGNOSIS — E079 Disorder of thyroid, unspecified: Secondary | ICD-10-CM | POA: Insufficient documentation

## 2023-02-16 DIAGNOSIS — E669 Obesity, unspecified: Secondary | ICD-10-CM | POA: Insufficient documentation

## 2023-03-25 ENCOUNTER — Other Ambulatory Visit: Payer: Self-pay

## 2023-03-25 ENCOUNTER — Encounter (HOSPITAL_COMMUNITY): Payer: Self-pay | Admitting: Emergency Medicine

## 2023-03-25 ENCOUNTER — Emergency Department (HOSPITAL_COMMUNITY): Payer: BC Managed Care – PPO

## 2023-03-25 ENCOUNTER — Emergency Department (HOSPITAL_COMMUNITY)
Admission: EM | Admit: 2023-03-25 | Discharge: 2023-03-25 | Disposition: A | Discharge status: Home / Self Care | Payer: BC Managed Care – PPO | Attending: Emergency Medicine | Admitting: Emergency Medicine

## 2023-03-25 ENCOUNTER — Emergency Department (HOSPITAL_COMMUNITY)
Admission: EM | Admit: 2023-03-25 | Discharge: 2023-03-26 | Disposition: A | Discharge status: Home / Self Care | Payer: BC Managed Care – PPO | Attending: Emergency Medicine | Admitting: Emergency Medicine

## 2023-03-25 DIAGNOSIS — Z79899 Other long term (current) drug therapy: Secondary | ICD-10-CM | POA: Insufficient documentation

## 2023-03-25 DIAGNOSIS — E039 Hypothyroidism, unspecified: Secondary | ICD-10-CM | POA: Insufficient documentation

## 2023-03-25 DIAGNOSIS — R002 Palpitations: Secondary | ICD-10-CM | POA: Insufficient documentation

## 2023-03-25 DIAGNOSIS — R202 Paresthesia of skin: Secondary | ICD-10-CM | POA: Insufficient documentation

## 2023-03-25 DIAGNOSIS — R42 Dizziness and giddiness: Secondary | ICD-10-CM | POA: Insufficient documentation

## 2023-03-25 DIAGNOSIS — Z5321 Procedure and treatment not carried out due to patient leaving prior to being seen by health care provider: Secondary | ICD-10-CM | POA: Insufficient documentation

## 2023-03-25 LAB — BASIC METABOLIC PANEL
Anion gap: 9 (ref 5–15)
Anion gap: 7 (ref 5–15)
BUN: 9 mg/dL (ref 6–20)
BUN: 10 mg/dL (ref 6–20)
CO2: 25 mmol/L (ref 22–32)
CO2: 26 mmol/L (ref 22–32)
Calcium: 9 mg/dL (ref 8.9–10.3)
Calcium: 9.1 mg/dL (ref 8.9–10.3)
Chloride: 104 mmol/L (ref 98–111)
Chloride: 104 mmol/L (ref 98–111)
Creatinine, Ser: 0.86 mg/dL (ref 0.44–1.00)
Creatinine, Ser: 0.69 mg/dL (ref 0.44–1.00)
GFR, Estimated: >60 mL/min (ref >60)
GFR, Estimated: >60 mL/min (ref >60)
Glucose, Bld: 102 mg/dL — ABNORMAL HIGH (ref 70–99)
Glucose, Bld: 96 mg/dL (ref 70–99)
Potassium: 3.3 mmol/L — ABNORMAL LOW (ref 3.5–5.1)
Potassium: 3.7 mmol/L (ref 3.5–5.1)
Sodium: 138 mmol/L (ref 135–145)
Sodium: 137 mmol/L (ref 135–145)

## 2023-03-25 LAB — CBC
HCT: 36.7 % (ref 36.0–46.0)
HCT: 36 % (ref 36.0–46.0)
Hemoglobin: 11.5 g/dL — ABNORMAL LOW (ref 12.0–15.0)
Hemoglobin: 12 g/dL (ref 12.0–15.0)
MCH: 26.7 pg (ref 26.0–34.0)
MCH: 28.5 pg (ref 26.0–34.0)
MCHC: 31.3 g/dL (ref 30.0–36.0)
MCHC: 33.3 g/dL (ref 30.0–36.0)
MCV: 85.3 fL (ref 80.0–100.0)
MCV: 85.5 fL (ref 80.0–100.0)
Platelets: 364 10*3/uL (ref 150–400)
Platelets: 358 10*3/uL (ref 150–400)
RBC: 4.3 MIL/uL (ref 3.87–5.11)
RBC: 4.21 MIL/uL (ref 3.87–5.11)
RDW: 14.4 % (ref 11.5–15.5)
RDW: 14.4 % (ref 11.5–15.5)
WBC: 5.8 10*3/uL (ref 4.0–10.5)
WBC: 4.9 10*3/uL (ref 4.0–10.5)
nRBC: 0 % (ref 0.0–0.2)
nRBC: 0 % (ref 0.0–0.2)

## 2023-03-25 LAB — URINALYSIS, ROUTINE W REFLEX MICROSCOPIC
Bilirubin Urine: NEGATIVE
Glucose, UA: NEGATIVE mg/dL
Ketones, ur: NEGATIVE mg/dL
Leukocytes,Ua: NEGATIVE
Nitrite: NEGATIVE
Protein, ur: 30 mg/dL — AB
RBC / HPF: >50 RBC/hpf (ref 0–5)
Specific Gravity, Urine: 1.021 (ref 1.005–1.030)
pH: 5 (ref 5.0–8.0)

## 2023-03-25 LAB — HCG, SERUM, QUALITATIVE
Preg, Serum: NEGATIVE
Preg, Serum: NEGATIVE

## 2023-03-25 LAB — CBG MONITORING, ED
Glucose-Capillary: 93 mg/dL (ref 70–99)
Glucose-Capillary: 91 mg/dL (ref 70–99)

## 2023-03-25 LAB — TROPONIN I (HIGH SENSITIVITY): Troponin I (High Sensitivity): <2 ng/L (ref <18)

## 2023-03-25 NOTE — ED Notes (Signed)
Pt name was called x3 no answer pt is not in lobby

## 2023-03-25 NOTE — ED Notes (Signed)
Pt blood sugar was 91

## 2023-03-25 NOTE — ED Provider Triage Note (Signed)
Emergency Medicine Provider Triage Evaluation Note  Joy Ali , a 32 y.o. female  was evaluated in triage.  Pt complains of palpitations.  Review of Systems  Positive: As above Negative: As above  Physical Exam  BP 119/87 (BP Location: Right Arm)   Pulse 98   Temp 98.5 F (36.9 C) (Oral)   Resp 20   Ht 5' (1.524 m)   Wt 90.7 kg   SpO2 100%   BMI 39.06 kg/m  Gen:   Awake, no distress   Resp:  Normal effort  MSK:   Moves extremities without difficulty Other:    Medical Decision Making  Medically screening exam initiated at 4:12 AM.  Appropriate orders placed.  Joy Ali was informed that the remainder of the evaluation will be completed by another provider, this initial triage assessment does not replace that evaluation, and the importance of remaining in the ED until their evaluation is complete.     Marita [place], PA-C 03/25/23 [phone]

## 2023-03-25 NOTE — ED Triage Notes (Signed)
Pt arriving via [facility] from work due to feeling lightheaded. Pt seen for same at [facility] this morning. Pt ambulatory upon arrival.

## 2023-03-25 NOTE — ED Provider Triage Note (Signed)
Emergency Medicine Provider Triage Evaluation Note  Joy Ali , a 32 y.o. female  was evaluated in triage.  Pt complains of palpitations and dizziness.  Patient was reportedly seen at [facility] emergency department yesterday but was unable to be seen due to long wait.  She reports that she had some worsening in her dizziness earlier today.  Denies any recent substance use.  Review of Systems  Positive: As above Negative: As above  Physical Exam  BP 127/85 (BP Location: Right Arm)   Pulse 71   Temp 98.6 F (37 C) (Oral)   Resp 18   SpO2 98%  Gen:   Awake, no distress   Resp:  Normal effort  MSK:   Moves extremities without difficulty  Other:    Medical Decision Making  Medically screening exam initiated at 8:52 PM.  Appropriate orders placed.  Joy Ali was informed that the remainder of the evaluation will be completed by another provider, this initial triage assessment does not replace that evaluation, and the importance of remaining in the ED until their evaluation is complete.  Basic labs and cardiac labs ordered yesterday at [facility] emergency department.   Joy Knudsen, PA-C 03/25/23 2053

## 2023-03-25 NOTE — ED Notes (Signed)
Patient stated that they felt jittery and was feeling heart palpitations again. Patient also stated she was feeling like she was having trouble with her speech. Patient knows where they are, what day it is, etc. Rechecked vitals and CBG.

## 2023-03-25 NOTE — ED Triage Notes (Signed)
Patient bib ems from home for intermittent episodes of palpitations, shortness of breath, n/v, dry mouth, and feeling like throat closing.   Patient states last time she felt like this she was pregnant and drinking wine. Reports drinking wine tonight, unknown if pregnant but did have spotting today.

## 2023-03-26 DIAGNOSIS — R002 Palpitations: Secondary | ICD-10-CM | POA: Diagnosis not present

## 2023-03-26 LAB — T4, FREE: Free T4: 1 ng/dL (ref 0.61–1.12)

## 2023-03-26 LAB — TSH: TSH: 1.158 u[IU]/mL (ref 0.350–4.500)

## 2023-03-26 NOTE — Discharge Instructions (Addendum)
Please call your primary care clinic to schedule follow-up appointment for your symptoms today.  Your primary care provider can review your thyroid testing which was sent off today.  They may need to make adjustments to your medications.  You should return to the emergency department if you have new or worsening symptoms that are concerning to you, including lightheadedness, loss of consciousness, chest pain or pressure, difficulty breathing, or any other potential life-threatening symptoms.

## 2023-03-26 NOTE — ED Provider Notes (Signed)
[place] EMERGENCY DEPARTMENT AT [facility] Provider Note   CSN: [id number] Arrival date & time: 03/25/23  1940     History  Chief Complaint  Patient presents with   Dizziness    Joy Ali is a 32 y.o. female with a history of hypothyroidism presenting to ED with constellation of symptoms.  Patient reports that this began 2 nights ago where she had a sensation of very dry throat or dry mouth, then felt that she was have difficulty swallowing, then temporarily had some slurred speech.  She said she felt lightheaded as well as having palpitations.  She came to the [facility] ED 24 hours ago but left due to prolonged waiting time.  She tried to go to work today and began was having palpitation issues and had tingling in the left side of her face came back for reevaluation.  She reports other than her hypothyroidism she does not have any other medical issues.  Her father is a history of stiff man syndrome but there is no other known autoimmune disorders in the family.  She adamantly denies any recreational drug use or marijuana use.  She denies any active vertigo, dizziness or lightheadedness.  She says her only current symptom is that she feels "drained" but says she has not slept well and feels tired from being in the hospital for so long.  HPI     Home Medications Prior to Admission medications   Medication Sig Start Date End Date Taking? Authorizing Provider  acetaminophen (TYLENOL) 500 MG tablet Take 1,000 mg by mouth as needed for moderate pain.    [provider]  amoxicillin-clavulanate (AUGMENTIN) 875-125 MG tablet Take 1 tablet by mouth every 12 (twelve) hours. 01/28/23   Gustavus Bryant, FNP  EPINEPHrine 0.3 mg/0.3 mL IJ SOAJ injection Inject 0.3 mg into the muscle as needed for anaphylaxis. 06/30/21   Renne Crigler, PA-C  levothyroxine (SYNTHROID) 125 MCG tablet Take 1 tablet (125 mcg total) by mouth daily before breakfast. 08/24/21 04/11/22  Army Melia A,  PA-C  lidocaine (XYLOCAINE) 2 % solution Use as directed 15 mLs in the mouth or throat every 6 (six) hours as needed for mouth pain. May apply directly to affected area as needed. 01/28/23   Gustavus Bryant, FNP  Prenatal Vit-Fe Fumarate-FA (PRENATAL PO) Take 2 tablets by mouth daily.    [provider]      Allergies    Ibuprofen    Review of Systems   Review of Systems  Physical Exam Updated Vital Signs BP 119/79 (BP Location: Left Arm)   Pulse 66   Temp 98.2 F (36.8 C) (Oral)   Resp 18   SpO2 100%  Physical Exam Constitutional:      General: She is not in acute distress. HENT:     Head: Normocephalic and atraumatic.  Eyes:     Conjunctiva/sclera: Conjunctivae normal.     Pupils: Pupils are equal, round, and reactive to light.  Cardiovascular:     Rate and Rhythm: Normal rate and regular rhythm.     Pulses: Normal pulses.  Pulmonary:     Effort: Pulmonary effort is normal. No respiratory distress.  Skin:    General: Skin is warm and dry.  Neurological:     General: No focal deficit present.     Mental Status: She is alert and oriented to person, place, and time. Mental status is at baseline.     Cranial Nerves: No cranial nerve deficit.  Sensory: No sensory deficit.     Motor: No weakness.  Psychiatric:        Mood and Affect: Mood normal.        Behavior: Behavior normal.     ED Results / Procedures / Treatments   Labs (all labs ordered are listed, but only abnormal results are displayed) Labs Reviewed  URINALYSIS, ROUTINE W REFLEX MICROSCOPIC - Abnormal; Notable for the following components:      Result Value   APPearance HAZY (*)    Hgb urine dipstick LARGE (*)    Protein, ur 30 (*)    Bacteria, UA RARE (*)    All other components within normal limits  BASIC METABOLIC PANEL  CBC  HCG, SERUM, QUALITATIVE  TSH  T4, FREE  CBG MONITORING, ED    EKG EKG Interpretation Date/Time:  Thursday March 25 2023 21:47:39 EST Ventricular Rate:   70 PR Interval:  149 QRS Duration:  84 QT Interval:  416 QTC Calculation: 449 R Axis:   75  Text Interpretation: Sinus rhythm Confirmed by Alvester Chou [phone]) on 03/25/2023 11:39:25 PM  Radiology DG Chest 2 View  Result Date: 03/25/2023 CLINICAL DATA:  32 year old female with chest pain, palpitations, shortness of breath, nausea vomiting, feeling of throat closing. EXAM: CHEST - 2 VIEW COMPARISON:  None Available. FINDINGS: Normal lung volumes and mediastinal contours. Visualized tracheal air column is within normal limits. Both lungs are clear. No pneumothorax or pleural effusion. Negative visible bowel gas and osseous structures. IMPRESSION: Negative.  No cardiopulmonary abnormality. Electronically Signed   By: Odessa Fleming M.D.   On: 03/25/2023 05:43    Procedures Procedures    Medications Ordered in ED Medications - No data to display  ED Course/ Medical Decision Making/ A&P                                 Medical Decision Making Amount and/or Complexity of Data Reviewed Labs: ordered.   Patient is presenting with constellation of symptoms for the past 2 days.  It is not clear if there is one unifying etiology for all of the symptoms.  This could be an atypical viral syndrome -although she does not have localizing infectious symptoms on exam.  Her neurological exam is normal at this time and she does not have facial paresthesias.  I question a very mild early Bell's palsy that resolved?  This be very unlikely to be related to stroke or CNS lesion, or demyelinating disorder including multiple sclerosis, with no symptoms of optic neuritis no family history similar to that.  I do not see that there is an emergent indication for MRI imaging.  I reviewed the patient's labs, as well as her EKG.  These are all within normal limits.  No acute ischemic findings or evidence of arrhythmia on EKG.  I will send off thyroid testing and she can follow-up with her PCP for this issue, but I  do not suspect thyroid storm at this time.  She is stable for discharge.        Final Clinical Impression(s) / ED Diagnoses Final diagnoses:  Paresthesia  Palpitations  Lightheadedness    Rx / DC Orders ED Discharge Orders     None         Marifer Hurd, Kermit Balo, MD 03/26/23 [phone]

## 2023-11-05 ENCOUNTER — Ambulatory Visit: Admission: EM | Admit: 2023-11-05 | Discharge: 2023-11-05 | Disposition: A | Discharge status: Home / Self Care

## 2023-11-05 DIAGNOSIS — J0140 Acute pansinusitis, unspecified: Secondary | ICD-10-CM

## 2023-11-05 DIAGNOSIS — G44201 Tension-type headache, unspecified, intractable: Secondary | ICD-10-CM | POA: Diagnosis not present

## 2023-11-05 MED ORDER — AMOXICILLIN-POT CLAVULANATE 875-125 MG PO TABS: 1.0000 | ORAL_TABLET | Freq: Two times a day (BID) | ORAL | 0 refills | Status: DC

## 2023-11-05 MED ORDER — DEXAMETHASONE SODIUM PHOSPHATE 10 MG/ML IJ SOLN
10.0000 mg | Freq: Once | INTRAMUSCULAR | Status: AC
  Administered 2023-11-05: 10 mg via INTRAMUSCULAR

## 2023-11-05 MED ORDER — PREDNISONE 20 MG PO TABS: 20.0000 mg | ORAL_TABLET | Freq: Every day | ORAL | 0 refills | Status: AC

## 2023-11-05 MED ORDER — METOCLOPRAMIDE HCL 5 MG/ML IJ SOLN
5.0000 mg | Freq: Once | INTRAMUSCULAR | Status: AC
  Administered 2023-11-05: 5 mg via INTRAMUSCULAR

## 2023-11-05 NOTE — Discharge Instructions (Addendum)
 Treating you for an acute tension type headache which is likely related to your sinus infection.  You received a steroid injection along with Reglan which is treatment for your tension headache.  I have also prescribed oral prednisone  but do not start this medication until tomorrow this is just to help with some of the residual sinus inflammation which is likely contributing to her ongoing headache.  Start your antibiotic today after you have eaten food.  You will take the antibiotic twice daily for a total of 7 days.  Hydrate well with fluids.  Follow up with PCP if symptoms improved but do not completely resolved if at any point symptoms become severe and you have experienced any changes in vision or 10 out of 10 severity of pain headache this would be indication to go to the emergency department.

## 2023-11-05 NOTE — ED Triage Notes (Signed)
 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

## 2023-11-05 NOTE — ED Provider Notes (Addendum)
 [facility]    CSN: [id number] Arrival date & time: 11/05/23  1132      History   Chief Complaint No chief complaint on file.   HPI Joy Ali is a 32 y.o. female.   HPI Patient presents today with a 3-day history of persistent headache.  Patient reports she has been struggling with recovering from a upper respiratory illness which has been waxing and waning for over 3 weeks.  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  The headache is worse bilateral temples.  She did not lose consciousness with hitting her head.  She also continues to have congestion and facial pressure.  She has not taken any medications for the symptoms.  Denies any visual acuity changes.  Blood pressure stable.  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 No pertinent surgical history.  OB History     Gravida  3   Para  2   Term  1   Preterm      AB      Living  1      SAB      IAB      Ectopic      Multiple  0   Live Births  1            Home Medications    Prior to Admission medications   Medication Sig Start Date End Date Taking? Authorizing Provider  amoxicillin -clavulanate (AUGMENTIN ) 875-125 MG tablet Take 1 tablet by mouth every 12 (twelve) hours. 11/05/23  Yes Arloa Suzen RAMAN, NP  betamethasone dipropionate 0.05 % cream Apply 1 Application topically daily. 08/11/21  Yes [provider]  ferrous sulfate 325 (65 FE) MG tablet Take 325 mg by mouth daily with breakfast. 03/17/23  Yes [provider]  predniSONE  (DELTASONE )  20 MG tablet Take 1 tablet (20 mg total) by mouth daily with breakfast for 5 days. 11/06/23 11/11/23 Yes Arloa Suzen RAMAN, NP  acetaminophen  (TYLENOL ) 500 MG tablet Take 1,000 mg by mouth as needed for moderate pain.    [provider]  EPINEPHrine  0.3 mg/0.3 mL IJ SOAJ injection Inject 0.3 mg into the muscle as needed for anaphylaxis. 06/30/21   Geiple, Joshua, PA-C  ferrous sulfate 325 (65 FE) MG EC tablet Take 325 mg by mouth daily with breakfast.    [provider]  levothyroxine  (SYNTHROID ) 125 MCG tablet Take 1 tablet (125 mcg total) by mouth daily before breakfast. 08/24/21 04/11/22  Beverley Leita LABOR, PA-C  lidocaine  (XYLOCAINE ) 2 % solution Use as directed 15 mLs in the mouth or throat every 6 (six) hours as needed for mouth pain. May apply directly to affected area as needed. 01/28/23   Hazen Darryle BRAVO, FNP  Prenatal Vit w/Fe-Methylfol-FA (PNV PO) PNV    [provider]  Prenatal Vit-Fe Fumarate-FA (PRENATAL PO) Take 2 tablets by mouth daily.    [provider]  triamcinolone ointment (KENALOG) 0.1 % Apply 1 Application topically 2 (two) times daily.    [provider]    Family History Family  History  Problem Relation Age of Onset   Hypertension Father    Diabetes Father     Social History Social History   Tobacco Use   Smoking status: Never   Smokeless tobacco: Never  Vaping Use   Vaping status: Never Used  Substance Use Topics   Alcohol use: Not Currently   Drug use: Not Currently     Allergies   Ibuprofen   Review of Systems Review of Systems Pertinent negatives listed in HPI   Physical Exam Triage Vital Signs ED Triage Vitals  Encounter Vitals Group     BP 11/05/23 1144 103/71     Girls Systolic BP Percentile --      Girls Diastolic BP Percentile --      Boys Systolic BP Percentile --      Boys Diastolic BP Percentile --      Pulse Rate 11/05/23 1144 85     Resp 11/05/23 1144 18     Temp 11/05/23 1144 98.7 F  (37.1 C)     Temp Source 11/05/23 1144 Oral     SpO2 11/05/23 1144 97 %     Weight 11/05/23 1142 189 lb (85.7 kg)     Height 11/05/23 1142 5' 1 (1.549 m)     Head Circumference --      Peak Flow --      Pain Score 11/05/23 1140 9     Pain Loc --      Pain Education --      Exclude from Growth Chart --    No data found.  Updated Vital Signs BP 103/71 (BP Location: Left Arm)   Pulse 85   Temp 98.7 F (37.1 C) (Oral)   Resp 18   Ht 5' 1 (1.549 m)   Wt 189 lb (85.7 kg)   LMP 10/16/2023 (Approximate)   SpO2 97%   BMI 35.71 kg/m   Visual Acuity Right Eye Distance:   Left Eye Distance:   Bilateral Distance:    Right Eye Near:   Left Eye Near:    Bilateral Near:     Physical Exam Vitals reviewed.  Constitutional:      Appearance: Normal appearance.  HENT:     Head: Normocephalic and atraumatic.     Nose: Congestion and rhinorrhea present.  Eyes:     Extraocular Movements: Extraocular movements intact.     Pupils: Pupils are equal, round, and reactive to light.  Cardiovascular:     Rate and Rhythm: Normal rate and regular rhythm.  Pulmonary:     Effort: Pulmonary effort is normal.     Breath sounds: Normal breath sounds.  Musculoskeletal:     Cervical back: Normal range of motion.  Skin:    General: Skin is warm and dry.     Capillary Refill: Capillary refill takes less than 2 seconds.  Neurological:     General: No focal deficit present.     Mental Status: She is alert and oriented to person, place, and time.      UC Treatments / Results  Labs (all labs ordered are listed, but only abnormal results are displayed) Labs Reviewed - No data to display  EKG   Radiology No results found.  Procedures Procedures (including critical care time)  Medications Ordered in UC Medications  dexamethasone  (DECADRON ) injection 10 mg (10 mg Intramuscular Given 11/05/23 1351)  metoCLOPramide  (REGLAN ) injection 5 mg (5 mg Intramuscular Given 11/05/23 1354)     Initial Impression / Assessment and Plan /  UC Course  I have reviewed the triage vital signs and the nursing notes.  Pertinent labs & imaging results that were available during my care of the patient were reviewed by me and considered in my medical decision making (see chart for details).    Treated for acute sinusitis with intractable tension type headache.  Headache cocktail given here in clinic which included dexamethasone  10 mg IM and Reglan  IM.  Will continue home management with Augmentin  twice daily for 7 days and prednisone  20 mg daily for 5 days.  Patient encouraged to hydrate well with fluids.  Return precautions and ED precautions given if symptoms become severe.  Patient verbalized understanding and agreement with plan. Final Clinical Impressions(s) / UC Diagnoses   Final diagnoses:  Acute non-recurrent pansinusitis  Acute intractable tension-type headache     Discharge Instructions      Treating you for an acute tension type headache which is likely related to your sinus infection.  You received a steroid injection along with Reglan  which is treatment for your tension headache.  I have also prescribed oral prednisone  but do not start this medication until tomorrow this is just to help with some of the residual sinus inflammation which is likely contributing to her ongoing headache.  Start your antibiotic today after you have eaten food.  You will take the antibiotic twice daily for a total of 7 days.  Hydrate well with fluids.  Follow up with PCP if symptoms improved but do not completely resolved if at any point symptoms become severe and you have experienced any changes in vision or 10 out of 10 severity of pain headache this would be indication to go to the emergency department.       ED Prescriptions     Medication Sig Dispense Auth. Provider   amoxicillin -clavulanate (AUGMENTIN ) 875-125 MG tablet Take 1 tablet by mouth every 12 (twelve) hours. 14 tablet Arloa Suzen RAMAN, NP   predniSONE  (DELTASONE ) 20 MG tablet Take 1 tablet (20 mg total) by mouth daily with breakfast for 5 days. 5 tablet Arloa Suzen RAMAN, NP      PDMP not reviewed this encounter.   Arloa Suzen RAMAN, NP 11/08/23 0737    Arloa Suzen RAMAN, NP 11/08/23 [phone]

## 2023-11-25 IMAGING — US US OB COMP LESS 14 WK
1 series · 15 of 28 positions shown · non-contrast
Comparison: None Available.

CLINICAL DATA: Vaginal bleeding

EXAM:
OBSTETRIC <14 WK ULTRASOUND
TECHNIQUE: Transabdominal ultrasound was performed for evaluation of the
gestation as well as the maternal uterus and adnexal regions.

[Series 1: us ob comp less 14 wks mc & wl · 15 of 39 slices shown]
[im 1/39]
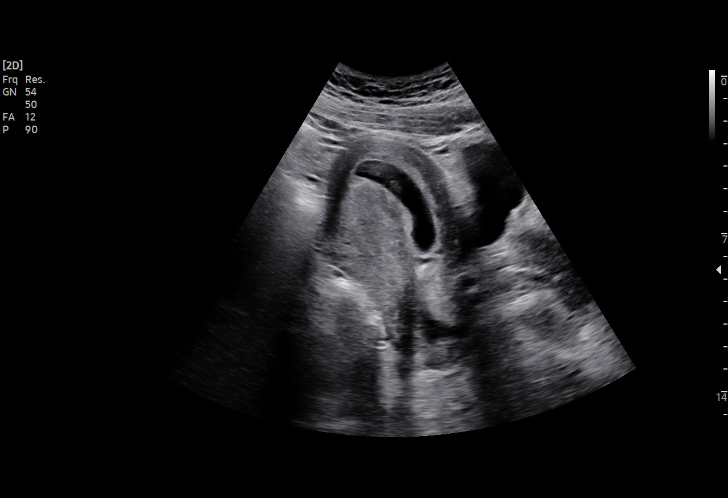
[im 3/39]
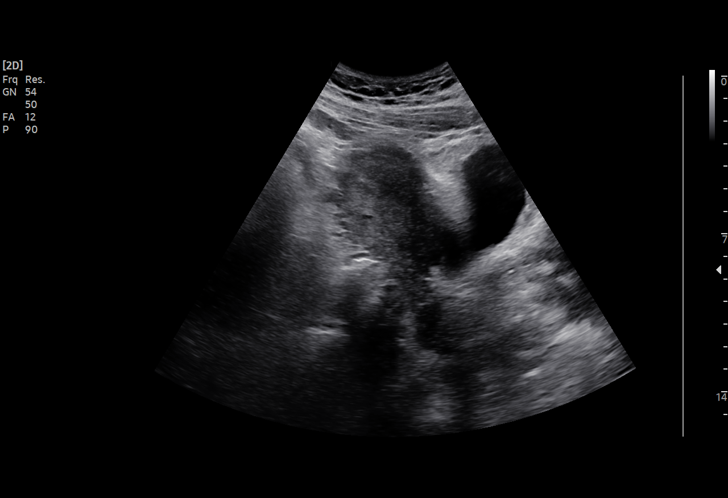
[im 6/39]
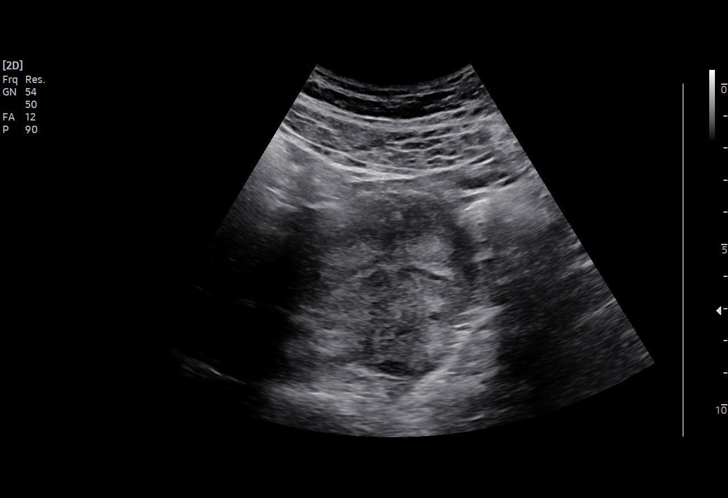
[im 9/39]
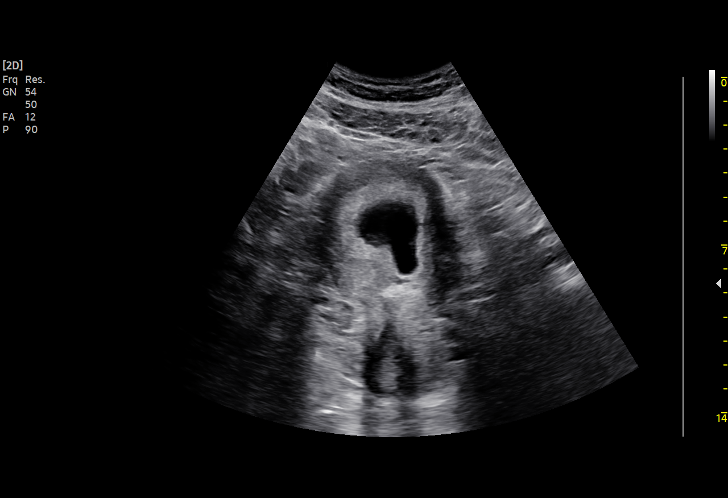
[im 12/39]
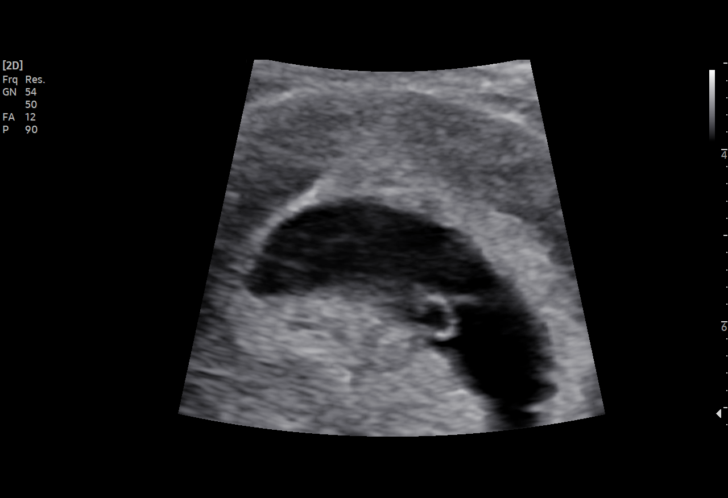
[im 15/39]
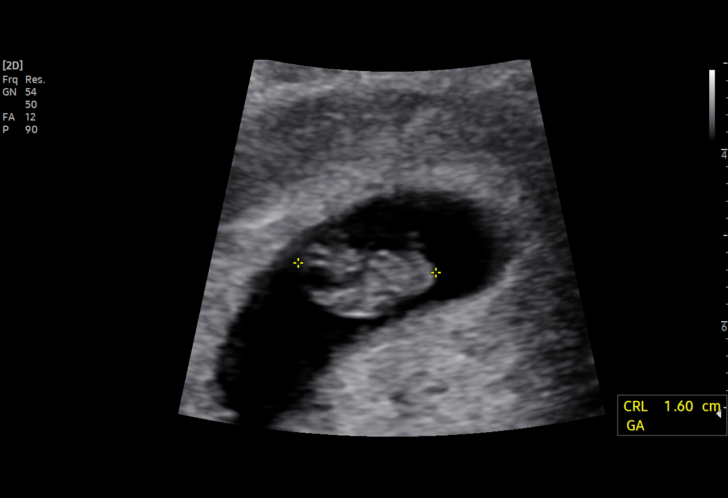
[im 17/39]
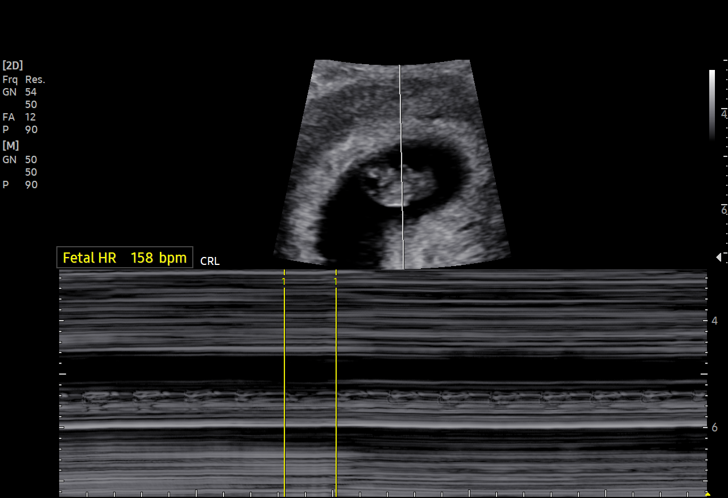
[im 20/39]
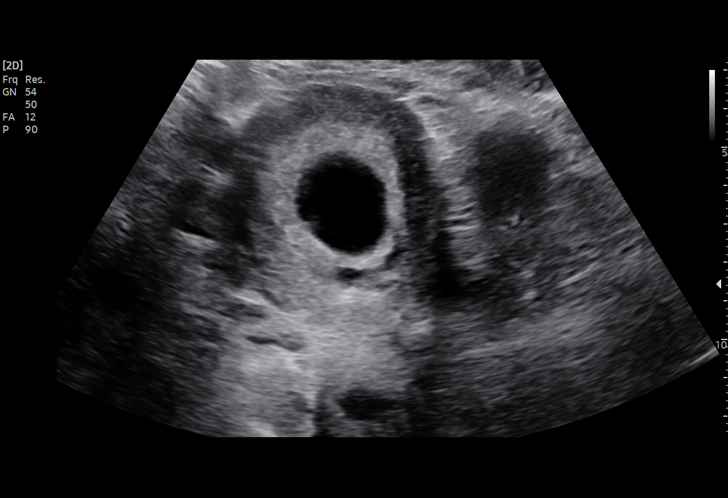
[im 22/39]
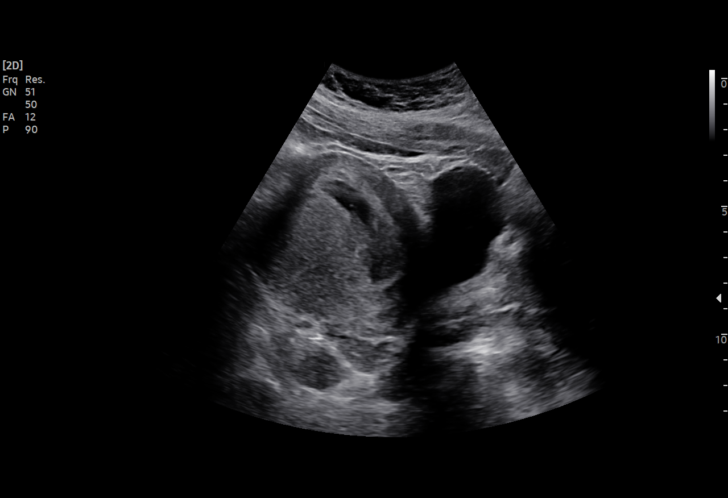
[im 24/39]
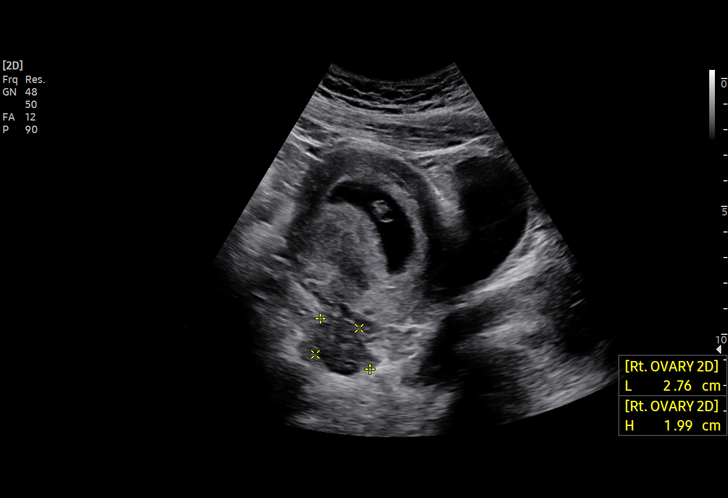
[im 27/39]
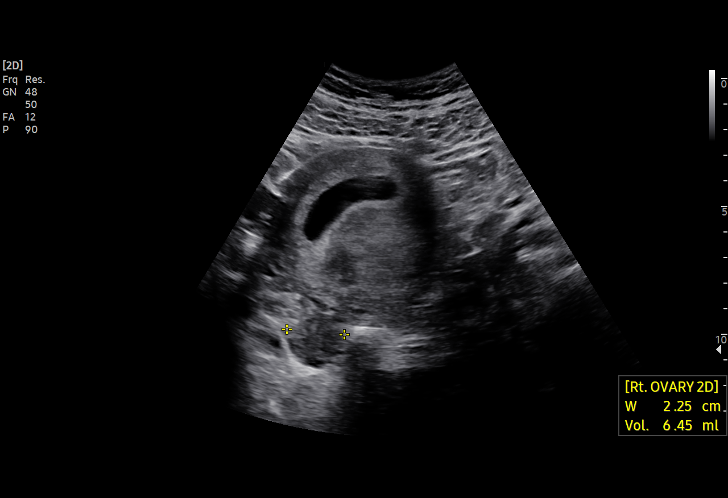
[im 30/39]
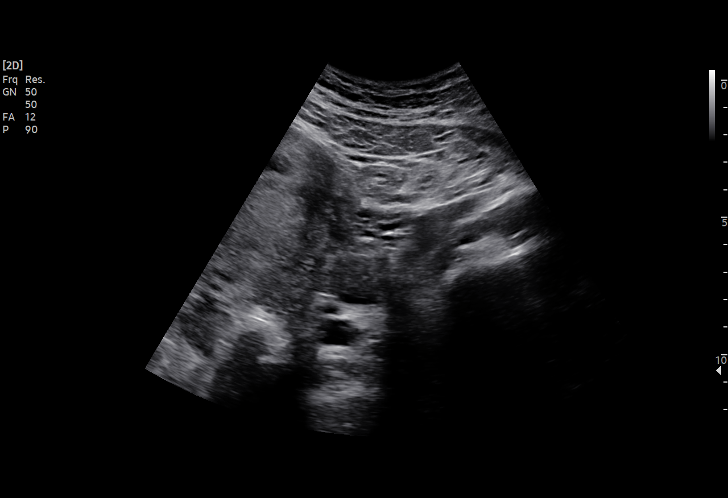
[im 33/39]
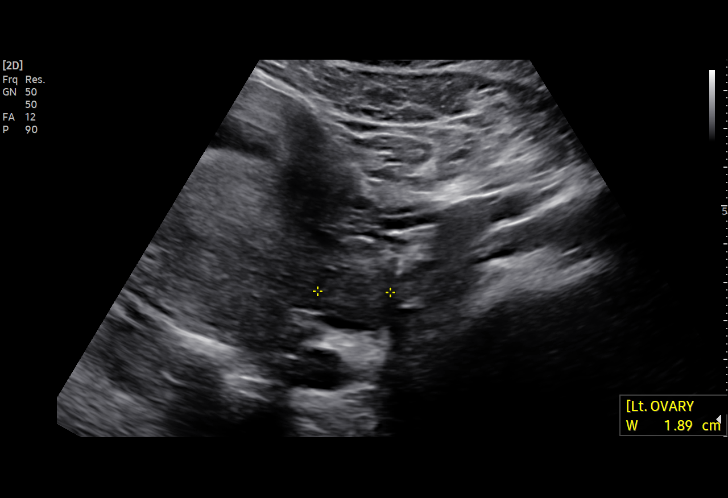
[im 36/39]
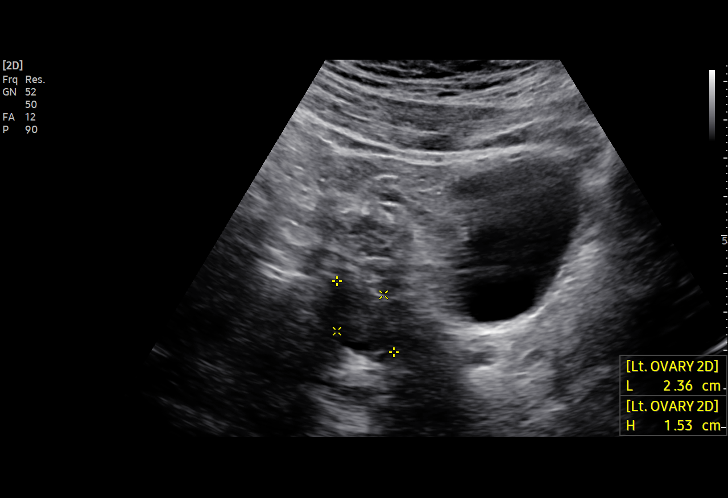
[im 39/39]
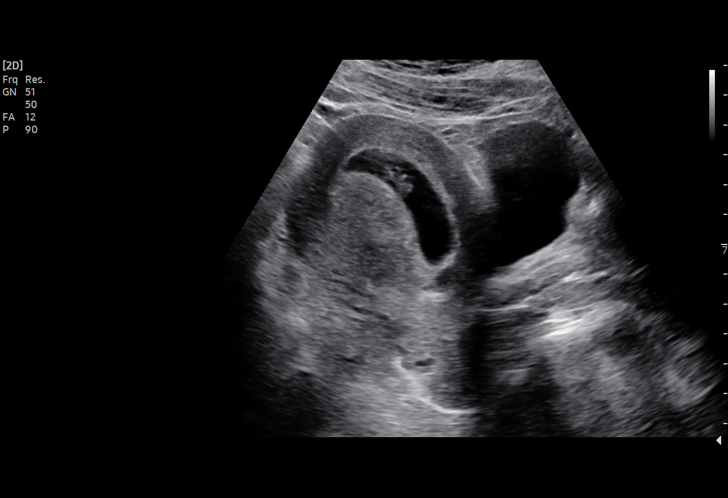

[15 of 28 positions shown; findings below may reference images not displayed]

FINDINGS: Intrauterine gestational sac: Single

Yolk sac:  Visualized.

Embryo:  Visualized.

Cardiac Activity: Visualized.

Heart Rate: 158 bpm

MSD:    mm    w     d

CRL:   16.4 mm   8 w 0 d                  US EDC: 04/29/2022

Subchorionic hemorrhage:  Small subchorionic hemorrhage

Maternal uterus/adnexae: No adnexal mass or free fluid
IMPRESSION: 8 week intrauterine pregnancy. Fetal heart rate 158 beats per
minute. Small subchorionic hemorrhage.

## 2023-12-01 NOTE — Unmapped External Note (Signed)
 [address]   What Is GERD?   If you often have a painful, burning feeling in your chest after you eat, you may have gastroesophageal reflux disease (GERD). Heartburn that keeps coming back is a classic symptom of GERD. But you may have other symptoms as well. A GERD diagnosis is made only after a complete evaluation by your health care provider.   Important   Chest pain may also be caused by heart problems. Be sure to have all chest pain checked by a health care provider.       When you have a reflux problem   After you eat, food travels from your mouth down the esophagus to your stomach. Along the way, food passes through a one-way valve called the lower esophageal sphincter (LES). The LES sits at the opening to your stomach. Normally the LES opens when you swallow. It lets food enter the stomach, then closes quickly. With GERD, the LES doesn?t work normally. It lets food and stomach acid flow back (reflux) into the esophagus. This creates the burning sensation.       With GERD, the weak LES allows food and fluids to travel back, or reflux, into the esophagus.         Some common symptoms   Common symptoms of GERD include:   ?  Frequent heartburn.   ?  Frequent burping.   ?  Sour- or acid-tasting fluid backing up into your mouth.   ?  Symptoms that get worse after you eat, bend over, or lie down.   ?  Trouble swallowing or pain when swallowing.   ?  A dry, long-term (chronic) cough, hoarseness.   ?  Upset stomach (nausea). Nausea and vomiting may be a sign of something else, so be sure to discuss with your health care provider.       Relieving your discomfort   You and your health care provider can work together to find the treatment choices to best ease your symptoms. These may include lifestyle changes, medicine, or possibly surgery.   Many people find their GERD symptoms decrease when they eat small frequent meals instead of 3 large ones. Reducing the amount of fatty, spicy, and acidic foods in your diet will also help.    Foods that tend to cause problems for people diagnosed with GERD include:   ?  Tomatoes and tomato products.   ?  Alcohol.   ?  Coffee.   ?  Peppermint.   ?  Greasy or spicy foods.   ?  Acidic foods, such as citrus.   To ease your symptoms, raise the head of your bed 4 to 6 inches. Don't eat anything within 2 to 3 hours of lying down.   If you are at a higher weight, losing weight will often ease GERD symptoms.   Stopping smoking may also help alleviate GERD symptoms.   Talk with your provider if you don?t understand how to make the dietary changes needed to control your GERD symptoms. Your provider can refer you to a nutritionist or recommend certain medicines to reduce the acid buildup.     Last Reviewed Date: 04/21/2023 00:00:00   ? 2000-2025 [employer], [place]. All rights reserved. This information is not intended as a substitute for professional medical care. Always follow your healthcare professional's instructions.

## 2023-12-01 NOTE — Progress Notes (Signed)
 Subjective   Joy Ali is a 33 year old female here for Chest Pain (Patient stated she has been having chest pain for the past 2 weeks. She stated when she wakes up in the morning her chest feels sore and whenever she moves her arms around. ), Thyroid  Problem (Patient requesting labs.  Patient has been experiencing some swelling in her throat after eating and trouble breathing.), and Excessive Menstrual Bleeding  HPI  This is my first encounter with patient.  In office EKG shows normal sinus rhythm with heart rate of 69. Denies family history of cardiac history or congenital heart disease. Reports she stopped taking her thyroid  medication related to hair loss and palpitations 3 months ago.  Endorses some GERD symptoms, GERD diet reviewed. Reports monthly heavy menstrual bleeding for about 1 year. States she is changing soaked overnight pad frequently, denies clots. Agrees to GYN referral.   Documentation Reviewed by Any User: Tobacco  Allergies  Meds  Problems   Med Hx  Surg Hx  Fam Hx     Current Outpatient Medications  Medication Sig Dispense Refill  . omeprazole (PRILOSEC) 20 mg DR capsule Take 1 Capsule by mouth every morning before breakfast. 30 Capsule 0  . ferrous sulfate 325 mg (65 mg iron) tablet Take 1 tablet 3 times a week (Mon, Wed, Fri for example). 90 Tablet 1  . EPINEPHrine  (EPIPEN ) 0.3 mg/0.3 mL pen injector Inject 0.3 mg into the muscle as needed    . levothyroxine  112 mcg tablet Take 1 Tablet by mouth every morning before breakfast for 180 days 30 Tablet 5  . triamcinolone (KENALOG) 0.1 % ointment Apply topically 2 (two) times daily 80 g 1   Review of Systems  HENT:  Positive for trouble swallowing.   Gastrointestinal:  Positive for indigestion. Negative for abdominal pain.  Genitourinary:  Positive for menstrual problem.     Objective   BP 107/78 (Right Arm, Sitting, Large Adult)  Pulse 81  Temp 97.9 F (36.6 C)  Ht 5' 1 (1.549 m)  Wt 196 lb (88.9 kg)   LMP 02/24/2023 (Exact Date)  SpO2 97%  BMI 37.03 kg/m  Smoking Status Never  BSA 1.96 m  Physical Exam Vitals and nursing note reviewed.   Constitutional:      Appearance: Normal appearance. She has obesity  Cardiovascular:     Rate and Rhythm: Normal rate.     Heart sounds: Normal heart sounds.  Pulmonary:     Effort: Pulmonary effort is normal.     Breath sounds: Normal breath sounds.  Skin:    General: Skin is warm and dry.  Neurological:     General: No focal deficit present.     Mental Status: She is alert and oriented to person, place, and time.    Assessment and Plan   1. Other chest pain (Primary) -     ECG ROUTINE ECG W/LEAST 12 LDS W/I&R 2. Abnormal thyroid  function test -     THYROID  CASCADE PROFILE Routine 3. Gastroesophageal reflux disease with esophagitis, unspecified whether hemorrhage -     omeprazole (PRILOSEC) 20 mg DR capsule; Take 1 Capsule by mouth every morning before breakfast., Disp-30 Capsule, R-0  e-Prescribing  Dispense: 30 Capsule; Refill: 0 4. Menorrhagia with regular cycle -     BLOOD COUNT COMPLETE AUTO&AUTO DIFRNTL WBC Routine -     COMPREHENSIVE METABOLIC PANEL Routine -     REFERRAL TO GYNECOLOGY  Total time spent on the date of the encounter was 40  minutes, which includes time spent in evaluation, counseling, coordination of care and documentation. More that 50% of this time was spent in counseling and coordination of care.   Regulatory Documentation: The following were addressed in today's visit:   Regulatory documentation not addressed.   Return if symptoms worsen or fail to improve.

## 2023-12-01 NOTE — Unmapped External Note (Signed)
 [id number] en  Heavy Menstrual Bleeding   Heavy menstrual bleeding means that your periods are heavier or longer than normal. You may soak through a pad or tampon every 1 to 3 hours on the heaviest days of your period. You may also pass large, dark clots. And your periods may last longer than 7 days.  If you have heavy periods often, this can cause a problem called anemia. With anemia, your red blood cell count is too low. Red blood cells are needed, as they help carry oxygen all over your body. Severe anemia may make you look pale and feel weak or tired. You might also get short of breath easily.  There are many possible causes of heavy menstrual bleeding. Hormonal imbalance is the most common cause. Having noncancer (benign) growths in your uterus is another cause. These include fibroids or polyps. Taking certain medicines or having certain health problems or bleeding disorders are also causes.  To treat heavy menstrual bleeding, your healthcare provider may prescribe medicines first. If these don't help, you may need more testing and treatments.  Home care  Medicines  If you're prescribed medicines, take them as directed.  To help control heavy bleeding, any of these may be used:  Hormone therapy (this includes all types of hormonal birth control such as pills, shots, cream, ring, patch, or hormone-releasing IUD)  Nonsteroidal anti-inflammatory drugs (NSAIDs), such as ibuprofen  Antifibrinolytic medicines, such as tranexamic acid   To help treat anemia, iron supplements may be prescribed.  General care  Get plenty of rest if you tire easily. Avoid heavy exertion.  To help ease pain or cramping, try using a heating pad on the lower belly or back. A warm bath may also help.    Follow-up care  Follow up with your health care provider, or as advised.    When to get medical advice  Call your health care provider right away if any of these occur:  Heavier bleeding (soaking 1 pad or tampon every hour  for 3 hours)  Heavy bleeding that lasts longer than 1 week  Fever of 100.22F (38C) or higher, or as directed by your health care provider  Pain or cramping that gets worse instead of better  Signs of anemia such as pale skin, extreme tiredness or weakness, or shortness of breath  Dizziness or fainting    Last Reviewed Date: 10/19/2023 00:00:00   2000-2025 [employer], [place]. All rights reserved. This information is not intended as a substitute for professional medical care. Always follow your healthcare professional's instructions.

## 2023-12-28 ENCOUNTER — Encounter: Admitting: Certified Nurse Midwife

## 2024-01-31 ENCOUNTER — Ambulatory Visit
Admission: EM | Admit: 2024-01-31 | Discharge: 2024-01-31 | Disposition: A | Discharge status: Home / Self Care | Payer: Self-pay

## 2024-01-31 ENCOUNTER — Encounter: Payer: Self-pay | Admitting: Emergency Medicine

## 2024-01-31 DIAGNOSIS — K029 Dental caries, unspecified: Secondary | ICD-10-CM

## 2024-01-31 DIAGNOSIS — K0889 Other specified disorders of teeth and supporting structures: Secondary | ICD-10-CM

## 2024-01-31 DIAGNOSIS — K0510 Chronic gingivitis, plaque induced: Secondary | ICD-10-CM

## 2024-01-31 MED ORDER — ACETAMINOPHEN-CODEINE 300-30 MG PO TABS: 1.0000 | ORAL_TABLET | Freq: Four times a day (QID) | ORAL | 0 refills | Status: AC | PRN

## 2024-01-31 MED ORDER — AMOXICILLIN-POT CLAVULANATE 875-125 MG PO TABS: 1.0000 | ORAL_TABLET | Freq: Two times a day (BID) | ORAL | 0 refills | Status: AC

## 2024-01-31 MED ORDER — CHLORHEXIDINE GLUCONATE 0.12 % MT SOLN: 15.0000 mL | OROMUCOSAL | 0 refills | Status: AC

## 2024-01-31 NOTE — Discharge Instructions (Addendum)
 You were seen today for dental pain caused by a cavity in your lower left molar with mild swelling and bleeding around the gum. There is no abscess or serious infection at this time. You have been prescribed Augmentin  to treat the infection and Peridex mouth rinse to help reduce bacteria and promote healing. Take the antibiotic exactly as directed and complete the full course, even if you start feeling better. For pain relief, take Tylenol  #3 as needed and avoid taking any additional acetaminophen -containing products while using this medication. You may also use warm saltwater rinses several times a day for comfort. Stick to soft foods such as soups, mashed potatoes, scrambled eggs, or yogurt until the area feels better. Avoid sticky, crunchy, or hard foods that may irritate the tooth. Rinse your mouth with warm water after eating and brush gently with a soft-bristle toothbrush to keep the area clean. Warm liquids can help ease discomfort, but avoid very hot or very cold foods or drinks. Follow up with your dentist as scheduled on the 21st. Contact your primary care provider or dentist sooner if pain worsens, swelling increases, or new drainage develops. Go to the emergency department immediately if you develop facial swelling, fever, difficulty swallowing, trouble opening your mouth, or any symptoms that make it hard to breathe.

## 2024-01-31 NOTE — ED Provider Notes (Signed)
 [facility]    CSN: [id number] Arrival date & time: 01/31/24  1109      History   Chief Complaint Chief Complaint  Patient presents with   Dental Pain    HPI Joy Ali is a 33 y.o. female.   Discussed the use of AI scribe software for clinical note transcription with the patient, who gave verbal consent to proceed.   The patient presents with dental pain that has been ongoing for approximately four days. The pain is located in the left lower back tooth area and radiates to the ear and neck, accompanied by a mild sore throat. The patient also reports experiencing slight headaches associated with the dental pain. The pain can be severe at times. The patient has a dentist appointment scheduled on the 21st of this month. Currently, the patient is not taking any medications for pain management. The patient denies any fevers, mouth sores, smoking, or vaping.  The following sections of the patient's history were reviewed and updated as appropriate: allergies, current medications, past family history, past medical history, past social history, past surgical history, and problem list.     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 No pertinent surgical history.  OB History     Gravida  3   Para  2   Term  1   Preterm      AB      Living  1      SAB      IAB      Ectopic      Multiple  0   Live Births  1            Home Medications    Prior to Admission medications   Medication Sig Start Date End Date Taking? Authorizing Provider  acetaminophen -codeine (TYLENOL  #3) 300-30 MG tablet Take 1-2 tablets by mouth every 6 (six) hours as needed (Take 1 tablet for  moderate pain (pain score less than 6) OR 2 tablets for severe pain (pain score 7-10)). 01/31/24  Yes Iola Lukes, FNP  amoxicillin -clavulanate (AUGMENTIN ) 875-125 MG tablet Take 1 tablet by mouth 2 (two) times daily after a meal. 01/31/24  Yes Iola Lukes, FNP  chlorhexidine (PERIDEX) 0.12 % solution Use as directed 15 mLs in the mouth or throat 2 (two) times daily at 8 am and 6 pm for 7 days. Brush teeth then swish in mouth for 2 minutes then spit out. Do not rinse mouth after use. Avoid eating, drinking, smoking and vaping for at least 30 minutes after use 01/31/24 02/07/24 Yes Clayton Bosserman, FNP  omeprazole (PRILOSEC) 20 MG capsule Take 20 mg by mouth. 12/01/23  Yes [provider]  betamethasone dipropionate 0.05 % cream Apply 1 Application topically daily. 08/11/21   [provider]  EPINEPHrine  0.3 mg/0.3 mL IJ SOAJ injection Inject 0.3 mg into the muscle as needed for anaphylaxis. 06/30/21   Geiple, Joshua, PA-C  ferrous sulfate 325 (65 FE) MG EC tablet Take 325 mg by mouth daily with breakfast.    [provider]  ferrous sulfate 325 (65 FE) MG tablet Take 325 mg by mouth daily with breakfast. 03/17/23   [provider]  levothyroxine  (SYNTHROID ) 125  MCG tablet Take 1 tablet (125 mcg total) by mouth daily before breakfast. 08/24/21 04/11/22  Beverley Leita LABOR, PA-C  Prenatal Vit w/Fe-Methylfol-FA (PNV PO) PNV    [provider]  Prenatal Vit-Fe Fumarate-FA (PRENATAL PO) Take 2 tablets by mouth daily.    [provider]  triamcinolone ointment (KENALOG) 0.1 % Apply 1 Application topically 2 (two) times daily.    [provider]    Family History Family History  Problem Relation Age of Onset   Hypertension Father    Diabetes Father     Social History Social History   Tobacco Use   Smoking status: Never    Passive exposure: Never   Smokeless tobacco: Never  Vaping Use   Vaping status: Never Used  Substance Use  Topics   Alcohol use: Not Currently   Drug use: Not Currently     Allergies   Ibuprofen   Review of Systems Review of Systems  Constitutional:  Negative for fever.  HENT:  Positive for dental problem. Negative for mouth sores.   Neurological:  Positive for headaches (mild, intermittent).  All other systems reviewed and are negative.    Physical Exam Triage Vital Signs ED Triage Vitals  Encounter Vitals Group     BP 01/31/24 1206 122/83     Girls Systolic BP Percentile --      Girls Diastolic BP Percentile --      Boys Systolic BP Percentile --      Boys Diastolic BP Percentile --      Pulse Rate 01/31/24 1206 80     Resp 01/31/24 1206 18     Temp 01/31/24 1206 98.5 F (36.9 C)     Temp Source 01/31/24 1206 Oral     SpO2 01/31/24 1206 97 %     Weight 01/31/24 1206 188 lb 15 oz (85.7 kg)     Height --      Head Circumference --      Peak Flow --      Pain Score 01/31/24 1205 8     Pain Loc --      Pain Education --      Exclude from Growth Chart --    No data found.  Updated Vital Signs BP 122/83 (BP Location: Right Arm)   Pulse 80   Temp 98.5 F (36.9 C) (Oral)   Resp 18   Wt 188 lb 15 oz (85.7 kg)   LMP 01/29/2024 (Exact Date)   SpO2 97%   Breastfeeding No   BMI 35.70 kg/m   Visual Acuity Right Eye Distance:   Left Eye Distance:   Bilateral Distance:    Right Eye Near:   Left Eye Near:    Bilateral Near:     Physical Exam Vitals reviewed.  Constitutional:      General: She is awake. She is not in acute distress.    Appearance: Normal appearance. She is well-developed. She is not ill-appearing, toxic-appearing or diaphoretic.  HENT:     Head: Normocephalic.     Right Ear: Hearing normal.     Left Ear: Hearing normal.     Nose: Nose normal.     Mouth/Throat:     Lips: Pink. No lesions.     Mouth: Mucous membranes are moist. No oral lesions.     Dentition: Normal dentition. No dental abscesses or gum lesions.     Tongue: No lesions.      Palate: No lesions.     Pharynx: Oropharynx is  clear. Uvula midline.   Eyes:     General: Vision grossly intact.     Conjunctiva/sclera: Conjunctivae normal.  Cardiovascular:     Rate and Rhythm: Normal rate and regular rhythm.     Heart sounds: Normal heart sounds.  Pulmonary:     Effort: Pulmonary effort is normal.     Breath sounds: Normal breath sounds and air entry.  Musculoskeletal:        General: Normal range of motion.     Cervical back: Normal range of motion and neck supple.  Skin:    General: Skin is warm and dry.  Neurological:     General: No focal deficit present.     Mental Status: She is alert and oriented to person, place, and time.  Psychiatric:        Speech: Speech normal.        Behavior: Behavior is cooperative.      UC Treatments / Results  Labs (all labs ordered are listed, but only abnormal results are displayed) Labs Reviewed - No data to display  EKG   Radiology No results found.  Procedures Procedures (including critical care time)  Medications Ordered in UC Medications - No data to display  Initial Impression / Assessment and Plan / UC Course  I have reviewed the triage vital signs and the nursing notes.  Pertinent labs & imaging results that were available during my care of the patient were reviewed by me and considered in my medical decision making (see chart for details).     The patient presents with dental pain secondary to visible dental decay of the left lower back molar, accompanied by mild gingival swelling and bleeding at the base of the tooth. There is no evidence of abscess formation, significant gum recession, or other oral lesions. Findings are consistent with localized dental infection and gingival inflammation.  Treatment initiated with Augmentin  to address potential bacterial infection and Peridex mouth rinses for local antiseptic care. Tylenol  #3 prescribed as needed for pain control. Patient advised to maintain a  soft food diet, including warm liquids and easy-to-chew foods, while avoiding sticky, crunchy, or hard items that may irritate the affected area. Recommended gentle tooth brushing with a soft-bristle brush and rinsing the mouth with warm water after meals to promote comfort and hygiene.  Patient instructed to follow up with dentistry as scheduled on the 21st for definitive evaluation and treatment. Advised to return sooner or seek emergency care if symptoms worsen, swelling increases, or fever develops.  Today's evaluation has revealed no signs of a dangerous process. Discussed diagnosis with patient and/or guardian. Patient and/or guardian aware of their diagnosis, possible red flag symptoms to watch out for and need for close follow up. Patient and/or guardian understands verbal and written discharge instructions. Patient and/or guardian comfortable with plan and disposition.  Patient and/or guardian has a clear mental status at this time, good insight into illness (after discussion and teaching) and has clear judgment to make decisions regarding their care  Documentation was completed with the aid of voice recognition software. Transcription may contain typographical errors.  Final Clinical Impressions(s) / UC Diagnoses   Final diagnoses:  Dentalgia  Gingivitis  Dental caries     Discharge Instructions      You were seen today for dental pain caused by a cavity in your lower left molar with mild swelling and bleeding around the gum. There is no abscess or serious infection at this time. You have been prescribed Augmentin  to  treat the infection and Peridex mouth rinse to help reduce bacteria and promote healing. Take the antibiotic exactly as directed and complete the full course, even if you start feeling better. For pain relief, take Tylenol  #3 as needed and avoid taking any additional acetaminophen -containing products while using this medication. You may also use warm saltwater rinses  several times a day for comfort. Stick to soft foods such as soups, mashed potatoes, scrambled eggs, or yogurt until the area feels better. Avoid sticky, crunchy, or hard foods that may irritate the tooth. Rinse your mouth with warm water after eating and brush gently with a soft-bristle toothbrush to keep the area clean. Warm liquids can help ease discomfort, but avoid very hot or very cold foods or drinks. Follow up with your dentist as scheduled on the 21st. Contact your primary care provider or dentist sooner if pain worsens, swelling increases, or new drainage develops. Go to the emergency department immediately if you develop facial swelling, fever, difficulty swallowing, trouble opening your mouth, or any symptoms that make it hard to breathe.      ED Prescriptions     Medication Sig Dispense Auth. Provider   amoxicillin -clavulanate (AUGMENTIN ) 875-125 MG tablet Take 1 tablet by mouth 2 (two) times daily after a meal. 14 tablet Iola Lukes, FNP   chlorhexidine (PERIDEX) 0.12 % solution Use as directed 15 mLs in the mouth or throat 2 (two) times daily at 8 am and 6 pm for 7 days. Brush teeth then swish in mouth for 2 minutes then spit out. Do not rinse mouth after use. Avoid eating, drinking, smoking and vaping for at least 30 minutes after use 210 mL Padme Arriaga, FNP   acetaminophen -codeine (TYLENOL  #3) 300-30 MG tablet Take 1-2 tablets by mouth every 6 (six) hours as needed (Take 1 tablet for moderate pain (pain score less than 6) OR 2 tablets for severe pain (pain score 7-10)). 15 tablet Iola Lukes, FNP      I have reviewed the PDMP during this encounter.   Iola Lukes, [place] 01/31/24 1329

## 2024-01-31 NOTE — ED Triage Notes (Signed)
 Pt presents c/o bottom left dental pain x 4 days. Pt reports she has a dentist appointment for 10/21 but just needs help maintaining the pain until then. Pt also reports she is having headaches, neck pain, and a little ear pain as additional sxs.
